# Patient Record
Sex: Male | Born: 1960 | Race: White | Hispanic: No | Marital: Single | Smoking: Current every day smoker
Health system: Southern US, Community
[De-identification: ages and names within clinical notes are randomized; demographics above are authoritative.]

## PROBLEM LIST (undated history)

## (undated) DIAGNOSIS — I10 Essential (primary) hypertension: Secondary | ICD-10-CM

## (undated) HISTORY — PX: TRANSCATHETER MITRAL EDGE TO EDGE REPAIR: CATH118311

---

## 2007-01-25 ENCOUNTER — Ambulatory Visit: Payer: Self-pay | Admitting: Cardiology

## 2007-02-07 ENCOUNTER — Ambulatory Visit: Payer: Self-pay | Admitting: Cardiology

## 2008-12-30 ENCOUNTER — Ambulatory Visit: Payer: Self-pay

## 2009-01-06 ENCOUNTER — Inpatient Hospital Stay: Payer: Self-pay | Admitting: Internal Medicine

## 2009-01-22 ENCOUNTER — Ambulatory Visit: Payer: Self-pay | Admitting: Specialist

## 2009-01-24 ENCOUNTER — Ambulatory Visit: Payer: Self-pay

## 2009-01-27 ENCOUNTER — Ambulatory Visit: Payer: Self-pay

## 2009-02-13 ENCOUNTER — Ambulatory Visit: Payer: Self-pay | Admitting: Specialist

## 2009-02-13 ENCOUNTER — Ambulatory Visit: Payer: Self-pay | Admitting: Internal Medicine

## 2009-02-21 ENCOUNTER — Ambulatory Visit: Payer: Self-pay | Admitting: Internal Medicine

## 2009-02-27 ENCOUNTER — Ambulatory Visit: Payer: Self-pay | Admitting: Internal Medicine

## 2009-02-27 ENCOUNTER — Ambulatory Visit: Payer: Self-pay

## 2009-03-29 ENCOUNTER — Ambulatory Visit: Payer: Self-pay | Admitting: Internal Medicine

## 2009-06-09 ENCOUNTER — Ambulatory Visit: Payer: Self-pay

## 2009-06-29 ENCOUNTER — Ambulatory Visit: Payer: Self-pay | Admitting: Internal Medicine

## 2009-07-21 ENCOUNTER — Ambulatory Visit: Payer: Self-pay | Admitting: Internal Medicine

## 2009-07-30 ENCOUNTER — Ambulatory Visit: Payer: Self-pay | Admitting: Internal Medicine

## 2009-08-29 ENCOUNTER — Ambulatory Visit: Payer: Self-pay | Admitting: Internal Medicine

## 2013-03-13 ENCOUNTER — Observation Stay: Payer: Self-pay | Admitting: Internal Medicine

## 2013-03-13 LAB — BASIC METABOLIC PANEL
Calcium, Total: 9.2 mg/dL (ref 8.5–10.1)
Chloride: 107 mmol/L (ref 98–107)
Co2: 28 mmol/L (ref 21–32)
Creatinine: 1.19 mg/dL (ref 0.60–1.30)
Glucose: 118 mg/dL — ABNORMAL HIGH (ref 65–99)
Potassium: 4.7 mmol/L (ref 3.5–5.1)
Sodium: 139 mmol/L (ref 136–145)

## 2013-03-13 LAB — CBC
Platelet: 273 10*3/uL (ref 150–440)
RBC: 5.43 10*6/uL (ref 4.40–5.90)
RDW: 13 % (ref 11.5–14.5)
WBC: 16.5 10*3/uL — ABNORMAL HIGH (ref 3.8–10.6)

## 2013-03-13 LAB — URINALYSIS, COMPLETE
Bacteria: NONE SEEN
Bilirubin,UR: NEGATIVE
Blood: NEGATIVE
Glucose,UR: NEGATIVE mg/dL (ref 0–75)
Ketone: NEGATIVE
Leukocyte Esterase: NEGATIVE
Nitrite: NEGATIVE
Protein: NEGATIVE
Squamous Epithelial: <1
WBC UR: 2 /HPF (ref 0–5)

## 2013-03-13 LAB — SEDIMENTATION RATE: Erythrocyte Sed Rate: 1 mm/hr (ref 0–20)

## 2013-03-14 LAB — HEMOGLOBIN A1C: Hemoglobin A1C: 5.2 % (ref 4.2–6.3)

## 2013-03-14 LAB — CBC WITH DIFFERENTIAL/PLATELET
Basophil #: 0.1 10*3/uL (ref 0.0–0.1)
Eosinophil #: 0.2 10*3/uL (ref 0.0–0.7)
HCT: 44 % (ref 40.0–52.0)
Lymphocyte #: 2.1 10*3/uL (ref 1.0–3.6)
MCH: 30.7 pg (ref 26.0–34.0)
Monocyte #: 0.8 x10 3/mm (ref 0.2–1.0)
Monocyte %: 9 %
Neutrophil #: 5.7 10*3/uL (ref 1.4–6.5)
Neutrophil %: 63.8 %
RBC: 4.84 10*6/uL (ref 4.40–5.90)
RDW: 13.2 % (ref 11.5–14.5)
WBC: 8.9 10*3/uL (ref 3.8–10.6)

## 2013-03-14 LAB — MAGNESIUM: Magnesium: 2.1 mg/dL

## 2013-03-14 LAB — CREATININE, SERUM
Creatinine: 1.34 mg/dL — ABNORMAL HIGH (ref 0.60–1.30)
EGFR (Non-African Amer.): >60

## 2013-03-14 LAB — LIPID PANEL
HDL Cholesterol: 38 mg/dL — ABNORMAL LOW (ref 40–60)
Ldl Cholesterol, Calc: 103 mg/dL — ABNORMAL HIGH (ref 0–100)

## 2013-03-18 LAB — CULTURE, BLOOD (SINGLE)

## 2013-03-26 DIAGNOSIS — R55 Syncope and collapse: Secondary | ICD-10-CM | POA: Insufficient documentation

## 2013-04-03 DIAGNOSIS — Z23 Encounter for immunization: Secondary | ICD-10-CM | POA: Insufficient documentation

## 2013-06-21 DIAGNOSIS — R000 Tachycardia, unspecified: Secondary | ICD-10-CM | POA: Insufficient documentation

## 2013-06-22 DIAGNOSIS — I10 Essential (primary) hypertension: Secondary | ICD-10-CM | POA: Insufficient documentation

## 2014-05-02 DIAGNOSIS — I059 Rheumatic mitral valve disease, unspecified: Secondary | ICD-10-CM | POA: Insufficient documentation

## 2014-05-02 DIAGNOSIS — I340 Nonrheumatic mitral (valve) insufficiency: Secondary | ICD-10-CM | POA: Insufficient documentation

## 2015-03-21 NOTE — Discharge Summary (Signed)
PATIENT NAME:  [name], [name] MR#:  [id number] DATE OF BIRTH:  03/17/1961  DATE OF ADMISSION:  03/13/2013 DATE OF DISCHARGE:  03/14/2013  PRIMARY CARE PHYSICIAN:  Dr. Moffett.  CONSULTATIONS:  Cardiology, Dr. Fath.   DISCHARGE DIAGNOSES: 1.  Syncope, possibly due to vasovagal reaction.  2.  Leukocytosis, possibly due to reaction.  3.  History of infective endocarditis.  4.  Mitral valve regurgitation with an ejection fraction 30% to 40%.   CONDITION:  Stable.   CODE STATUS:  FULL CODE.  HOME MEDICATIONS:  Aspirin 81 mg by mouth daily, lisinopril 2.5 mg by mouth daily.   DIET:  Low sodium diet.   ACTIVITY:  As tolerated.   FOLLOW-UP CARE:  Follow up with PCP within 1 to 2 weeks.  Follow up with Dr. Fath within 1 to 2 weeks.   HOSPITAL COURSE:  The patient is a 54-year-old Caucasian male with a history of mitral valve regurgitation status post repair and infective endocarditis presented in the ED with syncope episode yesterday.  The patient had a lot of cough and a few headaches, dizziness and then pass out.  For detailed history and physical examination, please refer to the admission note dictated by me yesterday.  On admission date, the patient's CAT scan of head did not show any intracranial abnormality.  Chest x-ray, no acute cardiopulmonary disease.  BUN 13, creatinine 1.19, WBC 16.5, hemoglobin 16.4, troponin was less than 0.02.  BNP 62.  EKG, normal sinus rhythm at 84 beats per minute.  The patient was admitted for syncope and leukocytosis.  Since the patient has a history of infective endocarditis and mitral valve regurgitation, patient was suspected to have possible endocarditis, so we got a blood culture before starting antibiotics.  Blood culture so far is negative.  I start Zosyn to vancomycin yesterday and patient's white count decreased to a normal range today.  In addition, patient's ESR, CRP is in normal range.  Dr. Fath suggests echocardiograph.  He suggested that if  echocardiography is unremarkable then patient may be discharged to home today.  Echocardiograph today showed left ventricular ejection fraction 35% to 40% with a moderate dilated left atrium and severe mitral valve regurgitation.  Carotid duplex showed no stenosis.  The patient has no symptoms.  Vital signs are stable.  Physical examination is unremarkable.  The patient is being discharged to home today.  Since the patient's ejection fraction is 35% to 40%, we will add Lisinopril 2.5 mg by mouth daily, but the patient has no sign of heart failure.  I discussed the patient's discharge plan with the patient.   TIME SPENT:  About 36 minutes.    ____________________________ Qing Chen, MD qc:ea D: 03/14/2013 18:16:57 ET T: 03/14/2013 18:52:00 ET JOB#: [id number]  cc: Qing Chen, MD, <Dictator> QING CHEN MD ELECTRONICALLY SIGNED 03/16/2013 10:06 

## 2015-03-21 NOTE — Consult Note (Signed)
   General Aspect 54 yo male with history of mitral valve repair for severe mr by Dr. Remo LippsSheridan at [facility] approximately 3 years ago who was admitted after reporting to the er with complaints of dizziness while coughing. Pt reported to the er that he had a syncopal episode. Head ct did not reveal any abnormality. Pt was admitted where he has not had any arrythmia and has ruled out for an mil Pt denies to me that he had any syncope. He states that he gets dizzy when he coughs and feels like he might pass out. He denies any dizziness or lightheadedness when he is not coughing. He states he is comliant with his medications.   Physical Exam:  GEN well developed, well nourished, no acute distress   HEENT PERRL, hearing intact to voice   NECK supple  No masses   RESP normal resp effort  clear BS  no use of accessory muscles   CARD Regular rate and rhythm  Murmur   Murmur Systolic   Systolic Murmur axilla   ABD denies tenderness  no liver/spleen enlargement  normal BS  no Adominal Mass   LYMPH negative neck, negative axillae   EXTR negative cyanosis/clubbing, negative edema   SKIN normal to palpation   NEURO cranial nerves intact, motor/sensory function intact   PSYCH A+O to time, place, person   Review of Systems:  Subjective/Chief Complaint dizziness and possible syncope   General: No Complaints   Skin: No Complaints   ENT: No Complaints   Neck: No Complaints   Respiratory: No Complaints   Cardiovascular: No Complaints   Gastrointestinal: No Complaints   Genitourinary: No Complaints   Vascular: No Complaints   Musculoskeletal: No Complaints   Neurologic: Dizzness  Fainting   Hematologic: No Complaints   Endocrine: No Complaints   Psychiatric: No Complaints   Review of Systems: All other systems were reviewed and found to be negative   Medications/Allergies Reviewed Medications/Allergies reviewed     Heart Murmer:    Cardiac Surgery:        Admit  Reason:   Syncope (780.2): Status: Active, Coding System: ICD9, Coded Name: Syncope and collapse  EKG:  EKG NSR    No Known Allergies:    Impression 54 yo male with history of mitral valve repair at [facility] several years ago who was admitted after presenting to the er with complaints of dizziness while coughing and sufferred a syncopal episode. Head ct was unremarkable for signfiant abnormality. He has ruled out for an mi. Echo is pending. No arrythmia on telemetry. Etiology of syncope was likely cough syncope . Will reveiw echo when available.   Plan 1. Conintue current meds 2. Review echo when available 3. Ambualte in am 4. If echo unremarkable and o furhter episodes consider discharge in am.   Electronic Signatures: Dalia HeadingFath, Eldar Robitaille A (MD)  (Signed 15-Apr-14 20:19)  Authored: General Aspect/Present Illness, History and Physical Exam, Review of System, Past Medical History, Health Issues, EKG , Allergies, Impression/Plan   Last Updated: 15-Apr-14 20:19 by Dalia HeadingFath, Shameika Speelman A (MD)

## 2015-03-21 NOTE — H&P (Signed)
PATIENT NAME:  Joe [name], Joe [name] MR#:  [id number] DATE OF BIRTH:  06/16/1961  DATE OF ADMISSION:  03/13/2013  PRIMARY CARE PHYSICIAN:  Dr. Marguerite Olea.  Referring PHYSICIAN:  Dr. Darnelle Catalan.  CHIEF COMPLAINT: Passed out today.   HISTORY OF PRESENT ILLNESS: The patient is a 54 year old Caucasian male with a history of infective endocarditis, severe mitral valve regurgitation status post repair in [facility] 3 years ago who presented in the ED with the above chief complaint. The patient is alert, awake, oriented, in no acute distress. The patient said that he passed out at about 4:30 a.m. After he woke up he had a lot of cough and felt a headache, dizzy before the syncope. He fell and injured his head but the CAT scan of the head did not show any hemorrhage or other abnormality. The patient denies any chest pain, palpitation, orthopnea or nocturnal dyspnea. No leg edema.  He denies any other symptoms.   PAST MEDICAL HISTORY: As mentioned above: Infective endocarditis, severe mitral valve regurgitation and right posterior parietal lobe lesion.   SOCIAL HISTORY: Smokes 1/2 pack a day since 54 years old, no alcohol.  Doubt illicit drugs.   FAMILY HISTORY: No hypertension, diabetes, heart attack or stroke.   PAST SURGICAL HISTORY: Mitral valve regurgitation repair.    REVIEW OF SYSTEMS:   CONSTITUTIONAL: The patient denies any fever or chills. No headache or dizziness. Has a headache and dizziness. No weakness.  EYES: No double vision, elevation.  ENT: No postnasal drip, slurred speech or dysphagia.  CARDIOVASCULAR: No chest pain, palpitation, orthopnea or nocturnal dyspnea. No leg edema.  PULMONARY: Positive for cough, sputum, no shortness of breath or hemoptysis.   GASTROINTESTINAL: No abdominal pain, nausea, vomiting or diarrhea.  GENITOURINARY: No dysuria, hematuria or incontinence.  SKIN: No rash or jaundice.  HEMATOLOGY: No easy bruising or bleeding.  ENDOCRINE: No polyuria or polyphagia, heat or cold  intolerance.  NEUROLOGY: Positive for syncope, loss of consciousness, but no seizure.   ALLERGIES: None.   HOME MEDICATIONS:  Aspirin 81 mg by mouth daily.  PHYSICAL EXAMINATION: VITAL SIGNS: Temperature 98.7, blood pressure 128/89, pulse 79, oxygen saturation 99% on room air.  GENERAL: The patient is alert, awake, oriented, in no acute distress.  HEENT: Pupils round, equal, and reactive to light and accommodation. Moist oral mucosa.  Clear pharynx. NECK: Supple. No JVD or carotid bruits. No lymphadenopathy. No thyromegaly.  CARDIOVASCULAR: S1 and S2 regular rate, rhythm. No murmurs or gallops.  PULMONARY: Bilateral air entry. No wheezing or rales. No use of accessory muscles to breathe.  ABDOMEN: Soft. No distention. No tenderness. No organomegaly. Bowel sounds present.  EXTREMITIES: No edema, clubbing or cyanosis. No calf tenderness. Strong bilateral pedal pulses.  SKIN: No rash or jaundice.  NEUROLOGY: A and o x 3. No focal deficit.   LABORATORY AND DIAGNOSTIC DATA:  CT of the head did not show any intracranial abnormality.  Chest x-ray:  No acute cardiopulmonary disease. Small nodular density at the right lung apex may be related to atypical pleural paroxysmal thickening. WBC 16.5, hemoglobin 16.4, platelets 273, glucose 118, BUN 13, creatinine 1.19. Electrolytes normal. Urinalysis negative.  Troponin less than 0.02. BNP 62. EKG showed normal sinus rhythm at 84 beats per minute.  IMPRESSION: 1.  Syncope.  2.  Leukocytosis.  3.  History of some infective endocarditis.  4.  Mitral valve regurgitation status post repair.   PLAN OF TREATMENT: The patient will be admitted to telemetry. We will get an echocardiograph and a  carotid duplex and continue aspirin. We will check lipid panel and follow up a cardiology consult from Dr. Lady GaryFath.  We will get a blood culture and follow-up CBC and then start Zosyn and vancomycin. GI and DVT prophylaxis. Discussed the patient's condition and plan of  treatment with the patient and the patient's mother.  The patient is full code.   TIME SPENT: About 61 minutes.    ____________________________ Shaune PollackQing Cathy Crounse, MD qc:ct D: 03/13/2013 14:10:56 ET T: 03/13/2013 14:29:24 ET JOB#: [id number]  cc: Shaune PollackQing Demtrius Rounds, MD, <Dictator> Shaune PollackQING Micharl Helmes MD ELECTRONICALLY SIGNED 03/15/2013 21:54

## 2018-06-25 ENCOUNTER — Encounter
Admission: EM | Disposition: A | Discharge status: Home / Self Care | Payer: Self-pay | Source: Home / Self Care | Attending: Emergency Medicine

## 2018-06-25 ENCOUNTER — Emergency Department
Admission: EM | Admit: 2018-06-25 | Discharge: 2018-06-26 | Disposition: A | Discharge status: Home / Self Care | Payer: Federal, State, Local not specified - PPO | Attending: Emergency Medicine | Admitting: Emergency Medicine

## 2018-06-25 ENCOUNTER — Emergency Department: Payer: Federal, State, Local not specified - PPO

## 2018-06-25 ENCOUNTER — Other Ambulatory Visit: Payer: Self-pay

## 2018-06-25 DIAGNOSIS — I10 Essential (primary) hypertension: Secondary | ICD-10-CM | POA: Insufficient documentation

## 2018-06-25 DIAGNOSIS — Y33XXXA Other specified events, undetermined intent, initial encounter: Secondary | ICD-10-CM | POA: Insufficient documentation

## 2018-06-25 DIAGNOSIS — K222 Esophageal obstruction: Secondary | ICD-10-CM | POA: Diagnosis not present

## 2018-06-25 DIAGNOSIS — F17200 Nicotine dependence, unspecified, uncomplicated: Secondary | ICD-10-CM | POA: Diagnosis not present

## 2018-06-25 DIAGNOSIS — T18128A Food in esophagus causing other injury, initial encounter: Secondary | ICD-10-CM | POA: Insufficient documentation

## 2018-06-25 DIAGNOSIS — T199XXA Foreign body in genitourinary tract, part unspecified, initial encounter: Secondary | ICD-10-CM

## 2018-06-25 HISTORY — DX: Essential (primary) hypertension: I10

## 2018-06-25 HISTORY — PX: ESOPHAGOGASTRODUODENOSCOPY (EGD) WITH PROPOFOL: SHX5813

## 2018-06-25 SURGERY — ESOPHAGOGASTRODUODENOSCOPY (EGD) WITH PROPOFOL
Anesthesia: General

## 2018-06-25 MED ORDER — PROPOFOL 10 MG/ML IV BOLUS
INTRAVENOUS | Status: AC
  Filled 2018-06-25: qty 20

## 2018-06-25 MED ORDER — SODIUM CHLORIDE 0.9 % IV BOLUS
1000.0000 mL | Freq: Once | INTRAVENOUS | Status: AC
  Administered 2018-06-26: 1000 mL via INTRAVENOUS

## 2018-06-25 MED ORDER — PROPOFOL 10 MG/ML IV BOLUS
INTRAVENOUS | Status: AC
  Filled 2018-06-25: qty 40

## 2018-06-25 MED ORDER — SODIUM CHLORIDE 0.9 % IV SOLN: INTRAVENOUS | Status: DC

## 2018-06-25 MED ORDER — GLUCAGON HCL RDNA (DIAGNOSTIC) 1 MG IJ SOLR
1.0000 mg | Freq: Once | INTRAMUSCULAR | Status: AC
  Administered 2018-06-25: 1 mg via INTRAVENOUS
  Filled 2018-06-25: qty 1

## 2018-06-25 NOTE — ED Triage Notes (Addendum)
Pt arrives to ED c/o of steak stuck in throat. Points to upper middle chest where he feels like steak is. Alert, oriented, ambulatory. No resp distress noted. States it spitting and able to drink fluids "more slowly than normal." pt talking in complete sentences.

## 2018-06-25 NOTE — ED Provider Notes (Signed)
[facility] Emergency Department Provider Note  ____________________________________________   First MD Initiated Contact with Patient 06/25/18 2144     (approximate)  I have reviewed the triage vital signs and the nursing notes.   HISTORY  Chief Complaint Swallowed Foreign Body    HPI Joe [name] is a 57 y.o. male with no contributory past medical history who presents for evaluation of probable steak bolus impacted in his esophagus.  He says that he was eating at about 2:00 PM when he feels like a piece of steak got stuck.  He has not been able to completely swallow since that time.  He is spitting his saliva into a cup most of the time but he is able to speak and occasionally swallow his secretions without difficulty.  He continues to have a foreign body sensation.  He is not in any respiratory distress.  The onset was acute and the feeling as severe but he is not in distress and nothing in particular makes it better or worse.  He denies chest pain, nausea, vomiting, abdominal pain, and shortness of breath.  This is not happened to him before although sometimes he feels like he gets choked on food but it is never gotten stuck for a long time.  He has never had an esophageal procedure.   Past Medical History:  Diagnosis Date  . Hypertension     There are no active problems to display for this patient.   Past Surgical History:  Procedure Laterality Date  . MITRAL VALVE REPAIR      Prior to Admission medications   Not on File    Allergies Patient has no known allergies.  History reviewed. No pertinent family history.  Social History Social History   Tobacco Use  . Smoking status: Current Every Day Smoker  Substance Use Topics  . Alcohol use: Yes  . Drug use: Not on file    Review of Systems Constitutional: No fever/chills Eyes: No visual changes. ENT: Food bolus impacted in the esophagus.  No sore throat. Cardiovascular: Denies chest  pain. Respiratory: Denies shortness of breath. Gastrointestinal: No abdominal pain.  No nausea, no vomiting.  No diarrhea.  No constipation. Genitourinary: Negative for dysuria. Musculoskeletal: Negative for neck pain.  Negative for back pain. Integumentary: Negative for rash. Neurological: Negative for headaches, focal weakness or numbness.   ____________________________________________   PHYSICAL EXAM:  VITAL SIGNS: ED Triage Vitals  Enc Vitals Group     BP 06/25/18 1811 (!) 185/94     Pulse Rate 06/25/18 1811 (!) 104     Resp 06/25/18 1811 16     Temp 06/25/18 1811 98.6 F (37 C)     Temp Source 06/25/18 1811 Oral     SpO2 06/25/18 1811 96 %     Weight 06/25/18 1812 81.6 kg (180 lb)     Height 06/25/18 1812 1.803 m (5\' 11" )     Head Circumference --      Peak Flow --      Pain Score 06/25/18 1811 2     Pain Loc --      Pain Edu? --      Excl. in GC? --     Constitutional: Alert and oriented. Well appearing and in no acute distress. Eyes: Conjunctivae are normal.  Head: Atraumatic. Nose: No congestion/rhinnorhea.   Mouth/Throat: Mucous membranes are moist. Neck: No stridor.  No meningeal signs.   Cardiovascular: Normal rate, regular rhythm. Good peripheral circulation. Grossly normal heart  sounds. Respiratory: Normal respiratory effort.  No retractions. Lungs CTAB. Gastrointestinal: Soft and nontender.  No distention.  Patient is spitting into a cup but not drooling or tripoding. Musculoskeletal: No lower extremity tenderness nor edema. No gross deformities of extremities. Neurologic:  Normal speech and language. No gross focal neurologic deficits are appreciated.  Skin:  Skin is warm, dry and intact. No rash noted. Psychiatric: Mood and affect are normal. Speech and behavior are normal.  ____________________________________________   LABS (all labs ordered are listed, but only abnormal results are displayed)  Labs Reviewed  CBC WITH DIFFERENTIAL/PLATELET    BASIC METABOLIC PANEL   ____________________________________________  EKG  None - EKG not ordered by ED physician ____________________________________________  RADIOLOGY Marylou MccoyI, Nealie Mchatton, personally viewed and evaluated these images (plain radiographs) as part of my medical decision making, as well as reviewing the written report by the radiologist.  ED MD interpretation: No indication of acute intrathoracic abnormality.  Official radiology report(s): Dg Chest 2 View  Result Date: 06/25/2018 CLINICAL DATA:  Sensation of foreign body stuck in throat/upper chest. EXAM: CHEST - 2 VIEW COMPARISON:  03/13/2013 FINDINGS: Prior median sternotomy and valve replacement. Mild hyperinflation. Heart is normal size. No confluent airspace opacities or effusions. No acute bony abnormality. No visible radiopaque foreign body. IMPRESSION: Hyperinflation.  No active disease. Electronically Signed   By: Charlett NoseKevin  Dover M.D.   On: 06/25/2018 19:29    ____________________________________________   PROCEDURES  Critical Care performed: No   Procedure(s) performed:   Procedures   ____________________________________________   INITIAL IMPRESSION / ASSESSMENT AND PLAN / ED COURSE  As part of my medical decision making, I reviewed the following data within the electronic MEDICAL RECORD NUMBER Nursing notes reviewed and incorporated, Labs reviewed  and A consult was requested and obtained from this/these consultant(s) Gastroenterology (Dr. Allegra LaiVanga).    Differential diagnosis includes, but is not limited to, food bolus impaction, esophageal web, achalasia, globus sensation.  The patient is having no airway compromise and does not appear to have complete esophageal obstruction although he is spitting his secretions into a cup.  I will try glucagon 1 mg IV and have him try to drink some diet Coke to see if any of this will pass.  I will then reassess the need for possible endoscopy.  Clinical Course as of Jun 25 2252  Wynelle LinkSun Jun 25, 2018  2240 The glucagon did not help with the patient's globus sensation.  He then tried drinking some Diet Coke and was able to keep it down only for about 15 or 20 seconds and then spit it back up with a small amount of clear emesis.  He is not able to pass any liquids.  I will call GI.   [CF]  2251 I spoke with Dr. Allegra LaiVanga by phone and explained the situation.  She said she would call the endoscopy suite for upper endoscopy to relieve the food bolus impaction.  I updated the patient.  I have ordered basic lab work and a 1 L normal saline IV fluid bolus since he has not been able to eat or drink anything for more than 8 hours.  He is calm and cooperative, not in distress, but still spitting into a cup.   [CF]    Clinical Course User Index [CF] Loleta RoseForbach, Cataleya Cristina, MD    ____________________________________________  FINAL CLINICAL IMPRESSION(S) / ED DIAGNOSES  Final diagnoses:  Esophageal obstruction due to food impaction     MEDICATIONS GIVEN DURING THIS VISIT:  Medications  sodium chloride 0.9 % bolus 1,000 mL (has no administration in time range)  glucagon (human recombinant) (GLUCAGEN) injection 1 mg (1 mg Intravenous Given 06/25/18 2208)     ED Discharge Orders    None       Note:  This document was prepared using Dragon voice recognition software and may include unintentional dictation errors.    Loleta Rose, MD 06/25/18 2253

## 2018-06-25 NOTE — ED Notes (Signed)
Pt given warm coke per Dr. Scotty CourtStafford verbal orders. Received verbal orders for chest xray as well. Pt has spit cup he is spitting into.

## 2018-06-25 NOTE — ED Notes (Signed)
Patient observed walking around lobby, without difficulty or distress.

## 2018-06-25 NOTE — ED Notes (Addendum)
Cola trial induced vomiting as "it wouldn't go down", pt reports "it's stuck here" -- points to sternum  Emesis was without chunks  Dr York CeriseForbach notified

## 2018-06-26 ENCOUNTER — Other Ambulatory Visit: Payer: Self-pay

## 2018-06-26 ENCOUNTER — Other Ambulatory Visit: Payer: Self-pay | Admitting: Gastroenterology

## 2018-06-26 ENCOUNTER — Emergency Department: Payer: Federal, State, Local not specified - PPO | Admitting: Certified Registered"

## 2018-06-26 ENCOUNTER — Telehealth: Payer: Self-pay | Admitting: Gastroenterology

## 2018-06-26 ENCOUNTER — Encounter: Payer: Self-pay | Admitting: *Deleted

## 2018-06-26 DIAGNOSIS — T18128A Food in esophagus causing other injury, initial encounter: Secondary | ICD-10-CM

## 2018-06-26 DIAGNOSIS — W44F3XA Food entering into or through a natural orifice, initial encounter: Secondary | ICD-10-CM

## 2018-06-26 DIAGNOSIS — K222 Esophageal obstruction: Secondary | ICD-10-CM

## 2018-06-26 LAB — BASIC METABOLIC PANEL
Anion gap: 6 (ref 5–15)
BUN: 19 mg/dL (ref 6–20)
CHLORIDE: 107 mmol/L (ref 98–111)
CO2: 31 mmol/L (ref 22–32)
CREATININE: 1.15 mg/dL (ref 0.61–1.24)
Calcium: 9.1 mg/dL (ref 8.9–10.3)
GFR calc Af Amer: >60 mL/min (ref >60)
GFR calc non Af Amer: >60 mL/min (ref >60)
Glucose, Bld: 115 mg/dL — ABNORMAL HIGH (ref 70–99)
Potassium: 4 mmol/L (ref 3.5–5.1)
Sodium: 144 mmol/L (ref 135–145)

## 2018-06-26 LAB — CBC WITH DIFFERENTIAL/PLATELET
Basophils Absolute: 0.1 10*3/uL (ref 0–0.1)
Basophils Relative: 1 %
EOS ABS: 0.2 10*3/uL (ref 0–0.7)
EOS PCT: 2 %
HCT: 46.9 % (ref 40.0–52.0)
HEMOGLOBIN: 16.2 g/dL (ref 13.0–18.0)
Lymphocytes Relative: 14 %
Lymphs Abs: 2 10*3/uL (ref 1.0–3.6)
MCH: 31.6 pg (ref 26.0–34.0)
MCHC: 34.5 g/dL (ref 32.0–36.0)
MCV: 91.6 fL (ref 80.0–100.0)
MONO ABS: 1 10*3/uL (ref 0.2–1.0)
MONOS PCT: 7 %
Neutro Abs: 11.1 10*3/uL — ABNORMAL HIGH (ref 1.4–6.5)
Neutrophils Relative %: 76 %
Platelets: 241 10*3/uL (ref 150–440)
RBC: 5.12 MIL/uL (ref 4.40–5.90)
RDW: 13.1 % (ref 11.5–14.5)
WBC: 14.4 10*3/uL — ABNORMAL HIGH (ref 3.8–10.6)

## 2018-06-26 MED ORDER — SUCCINYLCHOLINE CHLORIDE 20 MG/ML IJ SOLN
INTRAMUSCULAR | Status: DC | PRN
  Administered 2018-06-26: 80 mg via INTRAVENOUS

## 2018-06-26 MED ORDER — ALBUTEROL SULFATE HFA 108 (90 BASE) MCG/ACT IN AERS
INHALATION_SPRAY | RESPIRATORY_TRACT | Status: AC
  Filled 2018-06-26: qty 6.7

## 2018-06-26 MED ORDER — OMEPRAZOLE 20 MG PO TBDD: 20.0000 mg | DELAYED_RELEASE_TABLET | Freq: Two times a day (BID) | ORAL | 0 refills | Status: DC

## 2018-06-26 MED ORDER — ONDANSETRON HCL 4 MG/2ML IJ SOLN
INTRAMUSCULAR | Status: DC | PRN
  Administered 2018-06-26: 4 mg via INTRAVENOUS

## 2018-06-26 MED ORDER — ALBUTEROL SULFATE HFA 108 (90 BASE) MCG/ACT IN AERS
INHALATION_SPRAY | RESPIRATORY_TRACT | Status: DC | PRN
  Administered 2018-06-26: 6 via RESPIRATORY_TRACT

## 2018-06-26 MED ORDER — PROPOFOL 10 MG/ML IV BOLUS
INTRAVENOUS | Status: DC | PRN
  Administered 2018-06-26: 150 ug/kg/min via INTRAVENOUS

## 2018-06-26 MED ORDER — PROPOFOL 10 MG/ML IV BOLUS
INTRAVENOUS | Status: AC
  Filled 2018-06-26: qty 20

## 2018-06-26 MED ORDER — OMEPRAZOLE 20 MG PO CPDR: 20.0000 mg | DELAYED_RELEASE_CAPSULE | Freq: Two times a day (BID) | ORAL | 0 refills | Status: DC

## 2018-06-26 MED ORDER — LACTATED RINGERS IV SOLN
INTRAVENOUS | Status: DC | PRN
  Administered 2018-06-26: via INTRAVENOUS

## 2018-06-26 MED ORDER — PROPOFOL 10 MG/ML IV BOLUS
INTRAVENOUS | Status: DC | PRN
  Administered 2018-06-26: 160 mg via INTRAVENOUS

## 2018-06-26 NOTE — Transfer of Care (Signed)
Immediate Anesthesia Transfer of Care Note  Patient: Joe [name]  Procedure(s) Performed: ESOPHAGOGASTRODUODENOSCOPY (EGD) WITH PROPOFOL (N/A )  Patient Location: PACU  Anesthesia Type:General  Level of Consciousness: awake, alert  and oriented  Airway & Oxygen Therapy: Patient Spontanous Breathing  Post-op Assessment: Report given to RN and Post -op Vital signs reviewed and stable  Post vital signs: Reviewed and stable  Last Vitals:  Vitals Value Taken Time  BP    Temp    Pulse    Resp    SpO2      Last Pain:  Vitals:   06/26/18 0007  TempSrc:   PainSc: 5          Complications: No apparent anesthesia complications

## 2018-06-26 NOTE — Consult Note (Addendum)
Arlyss Repress, MD [address]  [address]  [place], [place] [address]  Main: [phone]  Fax: [phone] Pager: [phone]   Consultation  Referring Provider:     No ref. provider found Primary Care Physician:  Patient, No Pcp Per Primary Gastroenterologist:     none     Reason for Consultation:     Food impaction  Date of Admission:  06/25/2018 Date of Consultation:  06/26/2018         HPI:   Joe [name] is a 57 y.o. male with history of mitral valve repair presents with unable to pass the steak that he ate around 2 PM yesterday. He's unable to swallow his saliva since then. In the ER, they attempted Coke, glucagon which was unsuccessful. He denies heartburn, prior episodes. CBC and BMP unremarkable He does not have front teeth  NSAIDs: none  Antiplts/Anticoagulants/Anti thrombotics: aspirin 81   Past Medical History:  Diagnosis Date  . Hypertension     Past Surgical History:  Procedure Laterality Date  . MITRAL VALVE REPAIR      Prior to Admission medications   Medication Sig Start Date End Date Taking? Authorizing Provider  aspirin EC 81 MG tablet Take 81 mg by mouth daily.   Yes [provider]  lisinopril (PRINIVIL,ZESTRIL) 2.5 MG tablet Take 2.5 mg by mouth daily.   Yes [provider]    History reviewed. No pertinent family history.   Social History   Tobacco Use  . Smoking status: Current Every Day Smoker  Substance Use Topics  . Alcohol use: Yes  . Drug use: Not on file    Allergies as of 06/25/2018  . (No Known Allergies)    Review of Systems:    All systems reviewed and negative except where noted in HPI.   Physical Exam:  Vital signs in last 24 hours: Temp:  [98.6 F (37 C)] 98.6 F (37 C) (07/28 1811) Pulse Rate:  [80-104] 80 (07/29 0005) Resp:  [16-18] 18 (07/29 0005) BP: (144-185)/(90-105) 163/90 (07/29 0005) SpO2:  [96 %-97 %] 97 % (07/29 0005) Weight:  [180 lb (81.6 kg)] 180 lb (81.6 kg)  (07/28 1812)   General:   Pleasant, cooperative in NAD Head:  Normocephalic and atraumatic. Eyes:   No icterus.   Conjunctiva pink. PERRLA. Ears:  Normal auditory acuity. Neck:  Supple; no masses or thyroidomegaly Lungs: Respirations even and unlabored. Lungs clear to auscultation bilaterally.   No wheezes, crackles, or rhonchi.  Heart:  Regular rate and rhythm;  Without murmur, clicks, rubs or gallops Abdomen:  Soft, nondistended, nontender. Normal bowel sounds. No appreciable masses or hepatomegaly.  No rebound or guarding.  Rectal:  Not performed. Msk:  Symmetrical without gross deformities.  Strength normal  Extremities:  Without edema, cyanosis or clubbing. Neurologic:  Alert and oriented x3;  grossly normal neurologically. Skin:  Intact without significant lesions or rashes. Cervical Nodes:  No significant cervical adenopathy. Psych:  Alert and cooperative. Normal affect.  LAB RESULTS: CBC Latest Ref Rng & Units 06/25/2018 03/14/2013 03/13/2013  WBC 3.8 - 10.6 K/uL 14.4(H) 8.9 16.5(H)  Hemoglobin 13.0 - 18.0 g/dL 96.2 95.2 84.1  Hematocrit 40.0 - 52.0 % 46.9 44.0 49.0  Platelets 150 - 440 K/uL 241 221 273    BMET BMP Latest Ref Rng & Units 06/25/2018 03/14/2013 03/13/2013  Glucose 70 - 99 mg/dL 324(M) - 010(U)  BUN 6 - 20 mg/dL 19 - 13  Creatinine 7.25 - 1.24  mg/dL 1.301.15 8.65(H1.34(H) 8.461.19  Sodium 135 - 145 mmol/L 144 - 139  Potassium 3.5 - 5.1 mmol/L 4.0 - 4.7  Chloride 98 - 111 mmol/L 107 - 107  CO2 22 - 32 mmol/L 31 - 28  Calcium 8.9 - 10.3 mg/dL 9.1 - 9.2    LFT No flowsheet data found.   STUDIES: Dg Chest 2 View  Result Date: 06/25/2018 CLINICAL DATA:  Sensation of foreign body stuck in throat/upper chest. EXAM: CHEST - 2 VIEW COMPARISON:  03/13/2013 FINDINGS: Prior median sternotomy and valve replacement. Mild hyperinflation. Heart is normal size. No confluent airspace opacities or effusions. No acute bony abnormality. No visible radiopaque foreign body. IMPRESSION:  Hyperinflation.  No active disease. Electronically Signed   By: Charlett NoseKevin  Dover M.D.   On: 06/25/2018 19:29      Impression / Plan:   [name] is a 57 y.o. male with h/o mitral valve repair, presented with food impaction after eating piece of steak, likely due to poor dentition  Proceed with EGD  I have discussed alternative options, risks & benefits,  which include, but are not limited to, bleeding, infection, perforation,respiratory complication & drug reaction.  The patient agrees with this plan & written consent will be obtained.    Thank you for involving me in the care of this patient.      LOS: 0 days   Lannette Donathohini Trysta Showman, MD  06/26/2018, 12:12 AM   Note: This dictation was prepared with Dragon dictation along with smaller phrase technology. Any transcriptional errors that result from this process are unintentional.

## 2018-06-26 NOTE — Anesthesia Post-op Follow-up Note (Signed)
Anesthesia QCDR form completed.        

## 2018-06-26 NOTE — ED Notes (Signed)
Endo RN and OR tech at bedside with w/c to transport pt to endo, pt made a phone call to family members at 2350 to inform them he would need a ride when his procedure was complete, Blank procedure consent given to RN

## 2018-06-26 NOTE — Anesthesia Procedure Notes (Signed)
Procedure Name: Intubation Date/Time: 06/26/2018 12:29 AM Performed by: Chanetta Marshall, CRNA Pre-anesthesia Checklist: Patient identified, Emergency Drugs available, Suction available and Patient being monitored Patient Re-evaluated:Patient Re-evaluated prior to induction Oxygen Delivery Method: Circle system utilized Preoxygenation: Pre-oxygenation with 100% oxygen Induction Type: IV induction Laryngoscope Size: Mac and 3 Grade View: Grade I Number of attempts: 1 Airway Equipment and Method: Stylet Placement Confirmation: ETT inserted through vocal cords under direct vision,  positive ETCO2,  breath sounds checked- equal and bilateral and CO2 detector Secured at: 22 cm Tube secured with: Tape Dental Injury: Teeth and Oropharynx as per pre-operative assessment

## 2018-06-26 NOTE — Op Note (Signed)
[facility] Gastroenterology Patient Name: Joe [name] Procedure Date: 06/26/2018 12:28 AM MRN: [id number] Account #: [id number] Date of Birth: [birth year] Admit Type: Outpatient Age: 57 Room: [facility] ENDO ROOM 4 Gender: Male Note Status: Finalized Procedure:            Upper GI endoscopy Indications:          Foreign body in the esophagus Providers:            Toney Reil MD, MD Referring MD:         No Local Md, MD (Referring MD) Medicines:            General Anesthesia Complications:        No immediate complications. Estimated blood loss:                        Minimal. Procedure:            Pre-Anesthesia Assessment:                       - Prior to the procedure, a History and Physical was                        performed, and patient medications and allergies were                        reviewed. The patient is competent. The risks and                        benefits of the procedure and the sedation options and                        risks were discussed with the patient. All questions                        were answered and informed consent was obtained.                        Patient identification and proposed procedure were                        verified by the physician, the nurse, the                        anesthesiologist, the anesthetist and the technician in                        the pre-procedure area in the procedure room in the                        endoscopy suite. Mental Status Examination: alert and                        oriented. Airway Examination: normal oropharyngeal                        airway and neck mobility. Respiratory Examination:                        clear to auscultation. CV Examination: normal.  Prophylactic Antibiotics: The patient does not require                        prophylactic antibiotics. Prior Anticoagulants: The                        patient has taken aspirin, last dose was 2 days  prior                        to procedure. ASA Grade Assessment: II - A patient with                        mild systemic disease. After reviewing the risks and                        benefits, the patient was deemed in satisfactory                        condition to undergo the procedure. The anesthesia plan                        was to use general anesthesia. Immediately prior to                        administration of medications, the patient was                        re-assessed for adequacy to receive sedatives. The                        heart rate, respiratory rate, oxygen saturations, blood                        pressure, adequacy of pulmonary ventilation, and                        response to care were monitored throughout the                        procedure. The physical status of the patient was                        re-assessed after the procedure.                       After obtaining informed consent, the endoscope was                        passed under direct vision. Throughout the procedure,                        the patient's blood pressure, pulse, and oxygen                        saturations were monitored continuously. The Endoscope                        was introduced through the mouth, and advanced to the  second part of duodenum. The upper GI endoscopy was                        accomplished without difficulty. The patient tolerated                        the procedure well. Findings:      Food was found in the distal esophagus. Removal of food was accomplished       by push technique with minimal resistance. Food reached stomach.       Estimated blood loss was minimal. Biopsies were obtained from the       proximal and distal esophagus with cold forceps for histology of       suspected eosinophilic esophagitis.      The entire examined stomach was normal.      The cardia and gastric fundus were normal on retroflexion.      The  duodenal bulb and second portion of the duodenum were normal. Impression:           - Food in the distal esophagus. Biopsied. Removal was                        successful.                       - Normal stomach.                       - Normal duodenal bulb and second portion of the                        duodenum. Recommendation:       - Discharge patient to home (with escort).                       - Clear liquid diet today, advance diet tomorrow.                       - Poor dentition probably cause of food impaction                       - Continue present medications.                       - Use Prilosec (omeprazole) 20 mg PO BID for 4 weeks.                       - Await pathology results. Procedure Code(s):    --- Professional ---                       [phone], Esophagogastroduodenoscopy, flexible, transoral;                        with removal of foreign body(s)                       43239, Esophagogastroduodenoscopy, flexible, transoral;                        with biopsy, single or multiple Diagnosis Code(s):    --- Professional ---  W09.811B, Food in esophagus causing other injury,                        initial encounter                       T18.108A, Unspecified foreign body in esophagus causing                        other injury, initial encounter CPT copyright 2017 American Medical Association. All rights reserved. The codes documented in this report are preliminary and upon coder review may  be revised to meet current compliance requirements. Dr. Libby Maw Toney Reil MD, MD 06/26/2018 12:47:26 AM This report has been signed electronically. Number of Addenda: 0 Note Initiated On: 06/26/2018 12:28 AM      [facility]

## 2018-06-26 NOTE — Telephone Encounter (Signed)
PT LEFT VM HE SAW DR.VANGA IN ED LAST NIGHT SHE WROTE A PRESCRIPTION FOR TABLETS BUT INSURANCE WILL ONLY COVER THE CAPSULE PLEASE RESEND IT TO  [facility] IN [place] IN CAPSULE FORM

## 2018-06-26 NOTE — Anesthesia Preprocedure Evaluation (Signed)
Anesthesia Evaluation  Patient identified by MRN, date of birth, ID band Patient awake    Reviewed: Allergy & Precautions, H&P , NPO status , Patient's Chart, lab work & pertinent test results, reviewed documented beta blocker date and time   History of Anesthesia Complications Negative for: history of anesthetic complications  Airway Mallampati: III  TM Distance: >3 FB Neck ROM: full    Dental  (+) Dental Advidsory Given, Caps, Missing, Chipped, Poor Dentition   Pulmonary neg shortness of breath, neg sleep apnea, neg COPD, neg recent URI, Current Smoker,           Cardiovascular Exercise Tolerance: Good hypertension, (-) angina(-) CAD, (-) Past MI, (-) Cardiac Stents and (-) CABG (-) dysrhythmias + Valvular Problems/Murmurs (s/p mitral valve repair)      Neuro/Psych negative neurological ROS  negative psych ROS   GI/Hepatic negative GI ROS, Neg liver ROS,   Endo/Other  negative endocrine ROS  Renal/GU negative Renal ROS  negative genitourinary   Musculoskeletal   Abdominal   Peds  Hematology negative hematology ROS (+)   Anesthesia Other Findings Past Medical History: No date: Hypertension   Reproductive/Obstetrics negative OB ROS                             Anesthesia Physical Anesthesia Plan  ASA: II and emergent  Anesthesia Plan: General   Post-op Pain Management:    Induction: Intravenous, Rapid sequence and Cricoid pressure planned  PONV Risk Score and Plan: 1 and Ondansetron and Dexamethasone  Airway Management Planned: Oral ETT  Additional Equipment:   Intra-op Plan:   Post-operative Plan: Extubation in OR  Informed Consent: I have reviewed the patients History and Physical, chart, labs and discussed the procedure including the risks, benefits and alternatives for the proposed anesthesia with the patient or authorized representative who has indicated his/her  understanding and acceptance.   Dental Advisory Given  Plan Discussed with: Anesthesiologist, CRNA and Surgeon  Anesthesia Plan Comments:         Anesthesia Quick Evaluation

## 2018-06-26 NOTE — Telephone Encounter (Signed)
Medication has been resent today by Dr. Allegra LaiVanga

## 2018-06-27 LAB — SURGICAL PATHOLOGY

## 2018-06-28 ENCOUNTER — Telehealth: Payer: Self-pay | Admitting: Gastroenterology

## 2018-06-28 ENCOUNTER — Other Ambulatory Visit: Payer: Self-pay

## 2018-06-28 MED ORDER — OMEPRAZOLE 40 MG PO CPDR: 40.0000 mg | DELAYED_RELEASE_CAPSULE | Freq: Two times a day (BID) | ORAL | 1 refills | Status: DC

## 2018-06-28 NOTE — Telephone Encounter (Signed)
Pt left vm he states we have been trying to reach him for his Lab results but his Cell number is wrong in chart I will update please call [phone]

## 2018-06-29 NOTE — Telephone Encounter (Signed)
Spoke with pt and notified him of lab results and prescription sent to pharmacy

## 2018-07-04 NOTE — Anesthesia Postprocedure Evaluation (Signed)
Anesthesia Post Note  Patient: Joe BeetsMark A [name]  Procedure(s) Performed: ESOPHAGOGASTRODUODENOSCOPY (EGD) WITH PROPOFOL (N/A )  Patient location during evaluation: PACU Anesthesia Type: General Level of consciousness: awake and alert Pain management: pain level controlled Vital Signs Assessment: post-procedure vital signs reviewed and stable Respiratory status: spontaneous breathing, nonlabored ventilation, respiratory function stable and patient connected to nasal cannula oxygen Cardiovascular status: blood pressure returned to baseline and stable Postop Assessment: no apparent nausea or vomiting Anesthetic complications: no     Last Vitals:  Vitals:   06/26/18 0125 06/26/18 0130  BP: 132/82   Pulse: 84 82  Resp: (!) 24 (!) 22  Temp:  (!) 36.4 C  SpO2: 96% 96%    Last Pain:  Vitals:   06/26/18 0125  TempSrc:   PainSc: 0-No pain                 Lenard SimmerAndrew Icey Tello

## 2020-08-05 ENCOUNTER — Encounter: Payer: Self-pay | Admitting: *Deleted

## 2020-08-05 ENCOUNTER — Emergency Department: Payer: Federal, State, Local not specified - PPO

## 2020-08-05 ENCOUNTER — Other Ambulatory Visit: Payer: Self-pay

## 2020-08-05 DIAGNOSIS — M545 Low back pain: Secondary | ICD-10-CM | POA: Diagnosis not present

## 2020-08-05 DIAGNOSIS — F17200 Nicotine dependence, unspecified, uncomplicated: Secondary | ICD-10-CM | POA: Insufficient documentation

## 2020-08-05 DIAGNOSIS — R300 Dysuria: Secondary | ICD-10-CM | POA: Diagnosis present

## 2020-08-05 DIAGNOSIS — Z7982 Long term (current) use of aspirin: Secondary | ICD-10-CM | POA: Insufficient documentation

## 2020-08-05 DIAGNOSIS — R109 Unspecified abdominal pain: Secondary | ICD-10-CM | POA: Diagnosis not present

## 2020-08-05 DIAGNOSIS — Z20822 Contact with and (suspected) exposure to covid-19: Secondary | ICD-10-CM | POA: Diagnosis not present

## 2020-08-05 DIAGNOSIS — I10 Essential (primary) hypertension: Secondary | ICD-10-CM | POA: Diagnosis not present

## 2020-08-05 DIAGNOSIS — R0789 Other chest pain: Secondary | ICD-10-CM | POA: Insufficient documentation

## 2020-08-05 DIAGNOSIS — J189 Pneumonia, unspecified organism: Secondary | ICD-10-CM | POA: Diagnosis not present

## 2020-08-05 DIAGNOSIS — Z79899 Other long term (current) drug therapy: Secondary | ICD-10-CM | POA: Diagnosis not present

## 2020-08-05 LAB — URINALYSIS, COMPLETE (UACMP) WITH MICROSCOPIC
Bacteria, UA: NONE SEEN
Bilirubin Urine: NEGATIVE
Glucose, UA: NEGATIVE mg/dL
Hgb urine dipstick: NEGATIVE
Ketones, ur: 20 mg/dL — AB
Leukocytes,Ua: NEGATIVE
Nitrite: NEGATIVE
Protein, ur: 30 mg/dL — AB
Specific Gravity, Urine: 1.025 (ref 1.005–1.030)
pH: 5 (ref 5.0–8.0)

## 2020-08-05 LAB — CBC
HCT: 42.7 % (ref 39.0–52.0)
Hemoglobin: 14.3 g/dL (ref 13.0–17.0)
MCH: 31.6 pg (ref 26.0–34.0)
MCHC: 33.5 g/dL (ref 30.0–36.0)
MCV: 94.5 fL (ref 80.0–100.0)
Platelets: 331 10*3/uL (ref 150–400)
RBC: 4.52 MIL/uL (ref 4.22–5.81)
RDW: 12.3 % (ref 11.5–15.5)
WBC: 15.1 10*3/uL — ABNORMAL HIGH (ref 4.0–10.5)
nRBC: 0 % (ref 0.0–0.2)

## 2020-08-05 LAB — COMPREHENSIVE METABOLIC PANEL
ALT: 19 U/L (ref 0–44)
AST: 16 U/L (ref 15–41)
Albumin: 3.7 g/dL (ref 3.5–5.0)
Alkaline Phosphatase: 52 U/L (ref 38–126)
Anion gap: 9 (ref 5–15)
BUN: 15 mg/dL (ref 6–20)
CO2: 28 mmol/L (ref 22–32)
Calcium: 9.4 mg/dL (ref 8.9–10.3)
Chloride: 100 mmol/L (ref 98–111)
Creatinine, Ser: 1.22 mg/dL (ref 0.61–1.24)
GFR calc Af Amer: >60 mL/min (ref >60)
GFR calc non Af Amer: >60 mL/min (ref >60)
Glucose, Bld: 107 mg/dL — ABNORMAL HIGH (ref 70–99)
Potassium: 4.8 mmol/L (ref 3.5–5.1)
Sodium: 137 mmol/L (ref 135–145)
Total Bilirubin: 1.2 mg/dL (ref 0.3–1.2)
Total Protein: 7.7 g/dL (ref 6.5–8.1)

## 2020-08-05 NOTE — ED Triage Notes (Signed)
Pt reports low back pain with dysuria.  Sx since last week.  No hx kidney stones   No penile discharge.  Pt alert.

## 2020-08-06 ENCOUNTER — Emergency Department
Admission: EM | Admit: 2020-08-06 | Discharge: 2020-08-06 | Disposition: A | Discharge status: Home / Self Care | Payer: Federal, State, Local not specified - PPO | Attending: Emergency Medicine | Admitting: Emergency Medicine

## 2020-08-06 ENCOUNTER — Emergency Department: Payer: Federal, State, Local not specified - PPO

## 2020-08-06 DIAGNOSIS — R079 Chest pain, unspecified: Secondary | ICD-10-CM

## 2020-08-06 DIAGNOSIS — R300 Dysuria: Secondary | ICD-10-CM

## 2020-08-06 DIAGNOSIS — J189 Pneumonia, unspecified organism: Secondary | ICD-10-CM

## 2020-08-06 LAB — SARS CORONAVIRUS 2 BY RT PCR (HOSPITAL ORDER, PERFORMED IN ~~LOC~~ HOSPITAL LAB): SARS Coronavirus 2: NEGATIVE

## 2020-08-06 MED ORDER — SODIUM CHLORIDE 0.9 % IV SOLN: 500.0000 mg | Freq: Once | INTRAVENOUS | Status: DC

## 2020-08-06 MED ORDER — SODIUM CHLORIDE 0.9 % IV SOLN: 1.0000 g | Freq: Once | INTRAVENOUS | Status: DC

## 2020-08-06 MED ORDER — DOXYCYCLINE HYCLATE 50 MG PO CAPS: 100.0000 mg | ORAL_CAPSULE | Freq: Two times a day (BID) | ORAL | 0 refills | Status: AC

## 2020-08-06 NOTE — ED Provider Notes (Signed)
[facility] Emergency Department Provider Note   ____________________________________________   First MD Initiated Contact with Patient 08/06/20 1230     (approximate)  I have reviewed the triage vital signs and the nursing notes.   HISTORY  Chief Complaint Back Pain and Dysuria    HPI Joe [name] is a 59 y.o. male with a past medical history of hypertension presents for left lower back pain and dysuria that has been worsening over the last week and has no exacerbating or relieving factors.  Patient describes this pain as an aching, 4/10, left posterior flank and thoracic back pain that does not radiate.         Past Medical History:  Diagnosis Date  . Hypertension     Patient Active Problem List   Diagnosis Date Noted  . Esophageal obstruction due to food impaction     Past Surgical History:  Procedure Laterality Date  . ESOPHAGOGASTRODUODENOSCOPY (EGD) WITH PROPOFOL N/A 06/25/2018   Procedure: ESOPHAGOGASTRODUODENOSCOPY (EGD) WITH PROPOFOL;  Surgeon: Toney Reil, MD;  Location: [facility] ENDOSCOPY;  Service: Gastroenterology;  Laterality: N/A;  . MITRAL VALVE REPAIR      Prior to Admission medications   Medication Sig Start Date End Date Taking? Authorizing Provider  aspirin EC 81 MG tablet Take 81 mg by mouth daily.    [provider]  doxycycline (VIBRAMYCIN) 50 MG capsule Take 2 capsules (100 mg total) by mouth 2 (two) times daily for 7 days. 08/06/20 08/13/20  Merwyn Katos, MD  lisinopril (PRINIVIL,ZESTRIL) 2.5 MG tablet Take 2.5 mg by mouth daily.    [provider]  omeprazole (PRILOSEC) 40 MG capsule Take 1 capsule (40 mg total) by mouth 2 (two) times daily. 06/28/18   Toney Reil, MD    Allergies Patient has no known allergies.  No family history on file.  Social History Social History   Tobacco Use  . Smoking status: Current Every Day Smoker  . Smokeless tobacco: Never Used  Substance Use  Topics  . Alcohol use: Yes  . Drug use: Not Currently    Review of Systems Constitutional: No fever/chills Eyes: No visual changes. ENT: No sore throat. Cardiovascular: Denies chest pain. Respiratory: Denies shortness of breath. Gastrointestinal: No abdominal pain.  No nausea, no vomiting.  No diarrhea. Genitourinary: Positive for dysuria. Musculoskeletal: Positive for back pain.  Negative for acute arthralgias Skin: Negative for rash. Neurological: Negative for headaches, weakness/numbness/paresthesias in any extremity Psychiatric: Negative for suicidal ideation/homicidal ideation   ____________________________________________   PHYSICAL EXAM:  VITAL SIGNS: ED Triage Vitals  Enc Vitals Group     BP 08/05/20 1934 135/90     Pulse Rate 08/05/20 1934 (!) 111     Resp 08/05/20 1934 20     Temp 08/05/20 1934 (!) 100.5 F (38.1 C)     Temp Source 08/05/20 1934 Oral     SpO2 08/05/20 1934 97 %     Weight 08/05/20 1935 180 lb (81.6 kg)     Height 08/05/20 1935 5\' 11"  (1.803 m)     Head Circumference --      Peak Flow --      Pain Score 08/05/20 1935 8     Pain Loc --      Pain Edu? --      Excl. in GC? --    Constitutional: Alert and oriented. Well appearing and in no acute distress. Eyes: Conjunctivae are normal. PERRL. EOMI. Head: Atraumatic. Nose: No congestion/rhinnorhea. Mouth/Throat: Mucous  membranes are moist.  Oropharynx non-erythematous. Neck: No stridor.   Cardiovascular: Normal rate, regular rhythm. Grossly normal heart sounds.  Good peripheral circulation. Respiratory: Normal respiratory effort.  No retractions. Lungs CTAB. Gastrointestinal: Soft and nontender. No distention. No abdominal bruits. No CVA tenderness. Musculoskeletal: No lower extremity tenderness nor edema.  No joint effusions. Neurologic:  Normal speech and language. No gross focal neurologic deficits are appreciated. No gait instability. Skin:  Skin is warm, dry and intact. No rash  noted. Psychiatric: Mood and affect are normal. Speech and behavior are normal.  ____________________________________________   LABS (all labs ordered are listed, but only abnormal results are displayed)  Labs Reviewed  COMPREHENSIVE METABOLIC PANEL - Abnormal; Notable for the following components:      Result Value   Glucose, Bld 107 (*)    All other components within normal limits  CBC - Abnormal; Notable for the following components:   WBC 15.1 (*)    All other components within normal limits  URINALYSIS, COMPLETE (UACMP) WITH MICROSCOPIC - Abnormal; Notable for the following components:   Color, Urine YELLOW (*)    APPearance HAZY (*)    Ketones, ur 20 (*)    Protein, ur 30 (*)    All other components within normal limits  SARS CORONAVIRUS 2 BY RT PCR (HOSPITAL ORDER, PERFORMED IN [facility])   ___________________________________________________________________________________  RADIOLOGY  ED MD interpretation: 2 view chest x-ray as well as CT renal stone study show significant left lower lobe pneumonia without any other acute abnormalities  Official radiology report(s): DG Chest 2 View  Result Date: 08/06/2020 CLINICAL DATA:  Possible pneumonia on chest x-ray EXAM: CHEST - 2 VIEW COMPARISON:  Abdominal CT from yesterday FINDINGS: Left lower lobe infiltrate along the major fissure. Prior median sternotomy for mitral annuloplasty. No edema, effusion, or pneumothorax. IMPRESSION: Left lower lobe infiltrate. Followup PA and lateral chest X-ray is recommended in 3-4 weeks following trial of antibiotic therapy to ensure resolution. Electronically Signed   By: Marnee Spring M.D.   On: 08/06/2020 11:13   CT Renal Stone Study  Result Date: 08/05/2020 CLINICAL DATA:  Low back pain, flank pain EXAM: CT ABDOMEN AND PELVIS WITHOUT CONTRAST TECHNIQUE: Multidetector CT imaging of the abdomen and pelvis was performed following the standard protocol without IV contrast.  COMPARISON:  07/28/2009 FINDINGS: Lower chest: Airspace opacity in the left lower lobe concerning for pneumonia. No effusions. Hepatobiliary: No focal hepatic abnormality. Gallbladder unremarkable. Pancreas: No focal abnormality or ductal dilatation. Spleen: No focal abnormality.  Normal size. Adrenals/Urinary Tract: 3.2 cm low-density lesion in the upper pole of the right kidney, likely cyst. This is enlarged from 2.1 cm on prior study. No hydronephrosis. No renal or ureteral stones. Adrenal glands and urinary bladder unremarkable. Stomach/Bowel: Stomach, large and small bowel grossly unremarkable. Appendix is normal. Vascular/Lymphatic: Aortic atherosclerosis. No evidence of aneurysm or adenopathy. Reproductive: Mildly prominent prostate with central calcifications. Other: No free fluid or free air. Musculoskeletal: No acute bony abnormality. IMPRESSION: Consolidation in the left lower lobe concerning for pneumonia. No renal or ureteral stones.  No hydronephrosis. Aortic atherosclerosis. Electronically Signed   By: Charlett Nose M.D.   On: 08/05/2020 23:44    ____________________________________________   PROCEDURES  Procedure(s) performed (including Critical Care):  Procedures   ____________________________________________   INITIAL IMPRESSION / ASSESSMENT AND PLAN / ED COURSE      Presents with dysuria and left-sided back pain. DDx: PE, COPD exacerbation, Pneumothorax, TB, Atypical ACS, Esophageal Rupture, Toxic Exposure,  Foreign Body Airway Obstruction. Workup: CXR CBC, CMP, Trop, Lactate  Given History, Exam, and Workup presentation most consistent with pneumonia.  Findings:  Single Focus Consolidation on CXR  Rx: Doxycycline 100mg  BID x10days  Reassessment: Patient resting comfortably in no respiratory distress.  Patient not requiring any supplemental oxygen and agrees with plan for discharge home.  Disposition: Discharge home. Return precautions discussed at bedside and  patient in agreement with plan. Prompt follow up with primary care provider advised.     ____________________________________________   FINAL CLINICAL IMPRESSION(S) / ED DIAGNOSES  Final diagnoses:  Dysuria  Pneumonia of left lower lobe due to infectious organism  Chest pain, unspecified type     ED Discharge Orders         Ordered    doxycycline (VIBRAMYCIN) 50 MG capsule  2 times daily        08/06/20 1335           Note:  This document was prepared using Dragon voice recognition software and may include unintentional dictation errors.   10/06/20, MD 08/06/20 [phone]

## 2022-01-25 DIAGNOSIS — H66002 Acute suppurative otitis media without spontaneous rupture of ear drum, left ear: Secondary | ICD-10-CM | POA: Diagnosis not present

## 2022-01-25 DIAGNOSIS — B9689 Other specified bacterial agents as the cause of diseases classified elsewhere: Secondary | ICD-10-CM | POA: Diagnosis not present

## 2022-01-25 DIAGNOSIS — J019 Acute sinusitis, unspecified: Secondary | ICD-10-CM | POA: Diagnosis not present

## 2022-02-18 ENCOUNTER — Emergency Department
Admission: EM | Admit: 2022-02-18 | Discharge: 2022-02-18 | Disposition: A | Discharge status: Home / Self Care | Payer: Federal, State, Local not specified - PPO | Attending: Emergency Medicine | Admitting: Emergency Medicine

## 2022-02-18 ENCOUNTER — Other Ambulatory Visit: Payer: Self-pay

## 2022-02-18 ENCOUNTER — Emergency Department: Payer: Federal, State, Local not specified - PPO

## 2022-02-18 ENCOUNTER — Encounter: Payer: Self-pay | Admitting: Emergency Medicine

## 2022-02-18 DIAGNOSIS — I10 Essential (primary) hypertension: Secondary | ICD-10-CM | POA: Insufficient documentation

## 2022-02-18 DIAGNOSIS — R079 Chest pain, unspecified: Secondary | ICD-10-CM | POA: Diagnosis not present

## 2022-02-18 DIAGNOSIS — R0789 Other chest pain: Secondary | ICD-10-CM | POA: Diagnosis not present

## 2022-02-18 LAB — BASIC METABOLIC PANEL
Anion gap: 7 (ref 5–15)
BUN: 14 mg/dL (ref 8–23)
CO2: 28 mmol/L (ref 22–32)
Calcium: 8.9 mg/dL (ref 8.9–10.3)
Chloride: 104 mmol/L (ref 98–111)
Creatinine, Ser: 1.18 mg/dL (ref 0.61–1.24)
GFR, Estimated: >60 mL/min (ref >60)
Glucose, Bld: 143 mg/dL — ABNORMAL HIGH (ref 70–99)
Potassium: 3.5 mmol/L (ref 3.5–5.1)
Sodium: 139 mmol/L (ref 135–145)

## 2022-02-18 LAB — CBC
HCT: 46.8 % (ref 39.0–52.0)
Hemoglobin: 15.8 g/dL (ref 13.0–17.0)
MCH: 31 pg (ref 26.0–34.0)
MCHC: 33.8 g/dL (ref 30.0–36.0)
MCV: 91.8 fL (ref 80.0–100.0)
Platelets: 272 10*3/uL (ref 150–400)
RBC: 5.1 MIL/uL (ref 4.22–5.81)
RDW: 12.1 % (ref 11.5–15.5)
WBC: 6.6 10*3/uL (ref 4.0–10.5)
nRBC: 0 % (ref 0.0–0.2)

## 2022-02-18 LAB — TROPONIN I (HIGH SENSITIVITY)
Troponin I (High Sensitivity): 9 ng/L (ref <18)
Troponin I (High Sensitivity): 9 ng/L (ref <18)

## 2022-02-18 NOTE — Discharge Instructions (Signed)
Please call the number provided for cardiology to arrange a follow-up appointment.  Return to the emergency department for any return of/worsening chest pain, trouble breathing, or any other symptom personally concerning to yourself. ?

## 2022-02-18 NOTE — ED Triage Notes (Signed)
Pt via POV from home. Pt non-radiating, intermittent L-sided chest pain that started last night. Denies any other symptoms. Denies cough/SOB. Denies NVD. Pt has a hx of mitral valve replacement in 2006 but no other cardiac hx. Pt is A&Ox4 and NAD.  ?

## 2022-02-18 NOTE — ED Provider Notes (Signed)
? ?[facility] ?Provider Note ? ? ? Event Date/Time  ? First MD Initiated Contact with Patient 02/18/22 [phone]   ?  (approximate) ? ?History  ? ?Chief Complaint: Chest Pain ? ?HPI ? ?Joe [name] is a 61 y.o. male with a past medical history of hypertension, prior mitral valve repair in 2006 who presents to the emergency department for chest pain.  According to the patient over the past week or 2 he has been experiencing intermittent sharp pains in the left chest.  States they only last a very brief time and then go away.  Denies any nausea dyspnea or diaphoresis.  Patient denies any history of ACS, stents, etc.  No anticoagulation.  Patient denies any lower extremity swelling.  No shortness of breath or cough. ? ?Physical Exam  ? ?Triage Vital Signs: ?ED Triage Vitals  ?Enc Vitals Group  ?   BP 02/18/22 0716 (!) 173/90  ?   Pulse Rate 02/18/22 0716 99  ?   Resp 02/18/22 0716 17  ?   Temp 02/18/22 0716 99.6 ?F (37.6 ?C)  ?   Temp Source 02/18/22 0716 Oral  ?   SpO2 02/18/22 0716 97 %  ?   Weight 02/18/22 0715 185 lb (83.9 kg)  ?   Height 02/18/22 0715 5' 10.5" (1.791 m)  ?   Head Circumference --   ?   Peak Flow --   ?   Pain Score 02/18/22 0716 5  ?   Pain Loc --   ?   Pain Edu? --   ?   Excl. in GC? --   ? ? ?Most recent vital signs: ?Vitals:  ? 02/18/22 0716  ?BP: (!) 173/90  ?Pulse: 99  ?Resp: 17  ?Temp: 99.6 ?F (37.6 ?C)  ?SpO2: 97%  ? ? ?General: Awake, no distress.  ?CV:  Good peripheral perfusion.  Regular rate and rhythm  ?Resp:  Normal effort.  Equal breath sounds bilaterally.  ?Abd:  No distention.  Soft, nontender.  No rebound or guarding. ?Other:  No lower extremity edema/tenderness.  Chest wall is nontender to palpation. ? ? ?ED Results / Procedures / Treatments  ? ?EKG ? ?EKG viewed and interpreted by myself shows a normal sinus rhythm at 97 bpm with a narrow QRS, normal axis, normal intervals, no concerning ST changes. ? ?RADIOLOGY ? ?I personally viewed the chest x-ray images.   No acute significant abnormality seen on my evaluation. ?Radiology is read the chest x-ray is negative. ? ? ?MEDICATIONS ORDERED IN ED: ?Medications - No data to display ? ? ?IMPRESSION / MDM / ASSESSMENT AND PLAN / ED COURSE  ?I reviewed the triage vital signs and the nursing notes. ? ?Patient presents to the emergency department for intermittent chest pain over the past several weeks.  Currently the patient appears well, denies any discomfort currently.  No shortness of breath.  Overall patient appears well, reassuring physical exam.  Reassuring EKG.  Vital signs show hypertension otherwise no concerning findings.  We will check labs including cardiac enzymes we will obtain a chest x-ray and continue to closely monitor.  Patient is agreeable to plan of care. ? ?Patient's work-up is reassuring.  Patient's lab work is largely within normal limits including negative troponin x2.  Normal chemistry.  Reassuring CBC.  Patient is EKG is reassuring.  Chest x-ray negative x2.  Given the patient's age and history I did consider admission to the hospital however given his reassuring work-up negative troponin x2 and  as he remains chest pain-free I believe the patient is safe for discharge home with cardiology follow-up for a stress test.  Patient agreeable to plan. ? ?FINAL CLINICAL IMPRESSION(S) / ED DIAGNOSES  ? ?Chest pain ? ?Rx / DC Orders  ? ?Cardiology follow-up ?PCP follow-up ? ?Note:  This document was prepared using Dragon voice recognition software and may include unintentional dictation errors. ?  Minna Antis, MD ?02/18/22 1035 ? ?

## 2022-02-18 NOTE — ED Notes (Signed)
EDP in room  

## 2022-03-22 NOTE — Progress Notes (Signed)
Cardiology Office Note ? ?Date:  03/23/2022  ? ?ID:  Joe [name], DOB 1961/08/31, MRN [id number] ? ?PCP:  Pcp, No  ? ?Chief Complaint  ?Patient presents with  ? Follow up [facility] ER; chest pain   ?  "Doing well." Medications reviewed by the patient verbally.   ? ? ?HPI:  ?[name] is a 61 year old gentleman with past medical history of ?Hypertension ?Smoker 1 ppd ?Prior mitral valve repair 2006, valvuloplasty/ring ?seen in the emergency room February 18, 2022 for chest pain ?Who presents by referral from the emergency room for evaluation of his chest pain symptoms ? ?On evaluation in the emergency room, last month ? past week or 2 he has been experiencing intermittent sharp pains in the left chest.  States they only last a very brief time and then go away. ?Work-up in the ER was negative ? ?Sharp pain started 6 months ago, ?On and off until the , less  ?None since 3/23 ? 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 ?Smoker: 1 ppd ? ?Father with heart issue ? ?EKG personally reviewed by myself on todays visit ?Normal sinus rhythm rate 83 bpm T wave abnormality V3 through V5, consider old inferior MI ? ?PMH:   has a past medical history of Hypertension. ? ?PSH:    ?Past Surgical History:  ?Procedure Laterality Date  ? ESOPHAGOGASTRODUODENOSCOPY (EGD) WITH PROPOFOL N/A 06/25/2018  ? Procedure: ESOPHAGOGASTRODUODENOSCOPY (EGD) WITH PROPOFOL;  Surgeon: Lin Landsman, MD;  Location: [facility] ENDOSCOPY;  Service: Gastroenterology;  Laterality: N/A;  ? MITRAL VALVE REPAIR    ? ? ?No current outpatient medications on file.  ? ?No current facility-administered medications for this visit.  ?Currently not taking medications ?Old medications on his list which he is not taking aspirin, lisinopril 2.5 daily, omeprazole 40 twice daily ? ?Allergies:   Patient has no known allergies.  ? ?Social History:  The patient  reports that he has been smoking cigarettes. He has a 30.00 pack-year smoking history. He has never used smokeless  tobacco. He reports current alcohol use. He reports that he does not use drugs.  ? ?Family History:   family history includes Heart failure in his father.  ? ? ?Review of Systems: ?Review of Systems  ?Constitutional: Negative.   ?HENT: Negative.    ?Respiratory: Negative.    ?Cardiovascular:  Positive for chest pain.  ?Gastrointestinal: Negative.   ?Musculoskeletal: Negative.   ?Neurological: Negative.   ?Psychiatric/Behavioral: Negative.    ?All other systems reviewed and are negative. ? ? ?PHYSICAL EXAM: ?VS:  BP 132/80 (BP Location: Right Arm, Patient Position: Sitting, Cuff Size: Normal)   Pulse 83   Ht 5\' 11"  (1.803 m)   Wt 191 lb 2 oz (86.7 kg)   SpO2 98%   BMI 26.66 kg/m?  , BMI Body mass index is 26.66 kg/m?. ?GEN: Well nourished, well developed, in no acute distress ?HEENT: normal ?Neck: no JVD, carotid bruits, or masses ?Cardiac: RRR; no murmurs, rubs, or gallops,no edema  ?Respiratory:  clear to auscultation bilaterally, normal work of breathing ?GI: soft, nontender, nondistended, + BS ?MS: no deformity or atrophy ?Skin: warm and dry, no rash ?Neuro:  Strength and sensation are intact ?Psych: euthymic mood, full affect ? ?Recent Labs: ?02/18/2022: BUN 14; Creatinine, Ser 1.18; Hemoglobin 15.8; Platelets 272; Potassium 3.5; Sodium 139  ? ? ?Lipid Panel ?Lab Results  ?Component Value Date  ? CHOL 184 03/14/2013  ? HDL 38 (L) 03/14/2013  ? LDLCALC 103 (H) 03/14/2013  ?  TRIG 214 (H) 03/14/2013  ? ? ?Wt Readings from Last 3 Encounters:  ?03/23/22 191 lb 2 oz (86.7 kg)  ?02/18/22 185 lb (83.9 kg)  ?08/05/20 180 lb (81.6 kg)  ?  ? ?ASSESSMENT AND PLAN: ? ?Problem List Items Addressed This Visit   ?None ?Visit Diagnoses   ? ? Chest pain of uncertain etiology    -  Primary  ? Relevant Orders  ? EKG 12-Lead  ? H/O mitral valve repair      ? Relevant Orders  ? EKG 12-Lead  ? Angina pectoris ([place])      ? Relevant Orders  ? EKG 12-Lead  ? ?  ? ?Chest pain ?Abnormal EKG, smoker 1 pack/day, strong family history  father died age 21 cardiac disease having stuttering chest pain over the past 6 months, seems to be lasting longer now up to 15 minutes at a time ?-Presenting from the emergency room, work-up there negative ?-Discussed treatment options with him, we have ordered a cardiac CTA for further evaluation ? ?Mitral valve repair 2006 ?He is concerned about the longevity of his valve repair ?Was told that only last 10 years ?Echocardiogram ordered at his request ? ? ? Total encounter time more than 60 minutes ? Greater than 50% was spent in counseling and coordination of care with the patient ? ? ? ?Signed, ?Esmond Plants, M.D., Ph.D. ?[facility] [place], [place] ?[phone] ?

## 2022-03-23 ENCOUNTER — Encounter: Payer: Self-pay | Admitting: Cardiovascular Disease

## 2022-03-23 ENCOUNTER — Ambulatory Visit: Payer: Federal, State, Local not specified - PPO | Admitting: Cardiovascular Disease

## 2022-03-23 VITALS — BP 132/80 | HR 83 | Ht 71.0 in | Wt 191.1 lb

## 2022-03-23 DIAGNOSIS — I209 Angina pectoris, unspecified: Secondary | ICD-10-CM

## 2022-03-23 DIAGNOSIS — Z01812 Encounter for preprocedural laboratory examination: Secondary | ICD-10-CM

## 2022-03-23 DIAGNOSIS — R072 Precordial pain: Secondary | ICD-10-CM

## 2022-03-23 DIAGNOSIS — Z9889 Other specified postprocedural states: Secondary | ICD-10-CM | POA: Diagnosis not present

## 2022-03-23 DIAGNOSIS — R079 Chest pain, unspecified: Secondary | ICD-10-CM | POA: Diagnosis not present

## 2022-03-23 MED ORDER — METOPROLOL TARTRATE 100 MG PO TABS: ORAL_TABLET | ORAL | 0 refills | Status: DC

## 2022-03-23 NOTE — Patient Instructions (Addendum)
Medication Instructions:  ?No changes ? ?If you need a refill on your cardiac medications before your next appointment, please call your pharmacy.  ? ? ?Lab work: ?- Your physician recommends that you return for lab work prior to your Cardiac CT scan (any day before 5/4) : BMP ? ?[facility] ?1st desk on the right to check in (REGISTRATION) ?Lab hours: Monday- Friday (7:30 am- 5:30 pm) ? ? ?Testing/Procedures: ? ?1) Echocardiogram: ?- Your physician has requested that you have an echocardiogram. Echocardiography is a painless test that uses sound waves to create images of your heart. It provides your doctor with information about the size and shape of your heart and how well your heart?s chambers and valves are working. This procedure takes approximately one hour. There are no restrictions for this procedure. There is a possibility that an IV may need to be started during your test to inject an image enhancing agent. This is done to obtain more optimal pictures of your heart. Therefore we ask that you do at least drink some water prior to coming in to hydrate your veins.  ? ? ? ?2) Cardiac CT angiogram:  Thursday 04/01/22 (arrive at 12:15 pm) ?- Your physician has requested that you have cardiac CT. Cardiac computed tomography (CT) is a painless test that uses an x-ray machine to take clear, detailed pictures of your heart. ? ? ?Your cardiac CT will be scheduled at:  ? ?[facility] ?[address] ?[address] ?[place], [place] [address] ?([phone] ? ? ?Please arrive 15 mins early for check-in and test prep. ? ?Please follow these instructions carefully (unless otherwise directed): ? ?Hold all erectile dysfunction medications at least 3 days (72 hrs) prior to test. ? ?On the Night Before the Test: ?Be sure to Drink plenty of water. ?Do not consume any caffeinated/decaffeinated beverages or chocolate 12 hours prior to your test. ?Do not take any antihistamines 12  hours prior to your test. ? ? ?On the Day of the Test: ?Drink plenty of water until 1 hour prior to the test. ?Do not eat any food 4 hours prior to the test. ?You may take your regular medications prior to the test.  ?Take metoprolol (Lopressor) 100 mg two hours prior to test x 1 dose ? ?     ?After the Test: ?Drink plenty of water. ?After receiving IV contrast, you may experience a mild flushed feeling. This is normal. ?On occasion, you may experience a mild rash up to 24 hours after the test. This is not dangerous. If this occurs, you can take Benadryl 25 mg and increase your fluid intake. ?If you experience trouble breathing, this can be serious. If it is severe call 911 IMMEDIATELY. If it is mild, please call our office. ? ? ?Some insurance companies will need an authorization prior to the service being performed.  ? ?For non-scheduling related questions, please contact the cardiac imaging nurse navigator should you have any questions/concerns: ?Rockwell Alexandria, Cardiac Imaging Nurse Navigator ?Larey Brick, Cardiac Imaging Nurse Navigator ?[facility] ?Direct Office Dial: [phone]  ? ?For scheduling needs, including cancellations and rescheduling, please call [place], [phone]. ? ? ?Follow-Up: ?At [facility], you and your health needs are our priority.  As part of our continuing mission to provide you with exceptional heart care, we have created designated Provider Care Teams.  These Care Teams include your primary Cardiologist (physician) and Advanced Practice Providers (APPs -  Physician Assistants and  Nurse Practitioners) who all work together to provide you with the care you need, when you need it. ? ?You will need a follow up appointment as needed pending the results of your cardiac testing ? ?Providers on your designated Care Team:   ?Nicolasa Ducking, NP ?Eula Listen, PA-C ?Cadence Fransico Michael, PA-C ? ?COVID-19 Vaccine Information can be found at:  [email or web address] For questions related to vaccine distribution or appointments, please email [email or web address] or call [phone].  ? ? ?Echocardiogram ?An echocardiogram is a test that uses sound waves (ultrasound) to produce images of the heart. ?Images from an echocardiogram can provide important information about: ?Heart size and shape. ?The size and thickness and movement of your heart's walls. ?Heart muscle function and strength. ?Heart valve function or if you have stenosis. Stenosis is when the heart valves are too narrow. ?If blood is flowing backward through the heart valves (regurgitation). ?A tumor or infectious growth around the heart valves. ?Areas of heart muscle that are not working well because of poor blood flow or injury from a heart attack. ?Aneurysm detection. An aneurysm is a weak or damaged part of an artery wall. The wall bulges out from the normal force of blood pumping through the body. ?Tell a health care provider about: ?Any allergies you have. ?All medicines you are taking, including vitamins, herbs, eye drops, creams, and over-the-counter medicines. ?Any blood disorders you have. ?Any surgeries you have had. ?Any medical conditions you have. ?Whether you are pregnant or may be pregnant. ?What are the risks? ?Generally, this is a safe test. However, problems may occur, including an allergic reaction to dye (contrast) that may be used during the test. ?What happens before the test? ?No specific preparation is needed. You may eat and drink normally. ?What happens during the test? ? ?You will take off your clothes from the waist up and put on a hospital gown. ?Electrodes or electrocardiogram (ECG)patches may be placed on your chest. The electrodes or patches are then connected to a device that monitors your heart rate and rhythm. ?You will lie down on a table for an ultrasound exam. A gel will be applied to your chest to  help sound waves pass through your skin. ?A handheld device, called a transducer, will be pressed against your chest and moved over your heart. The transducer produces sound waves that travel to your heart and bounce back (or "echo" back) to the transducer. These sound waves will be captured in real-time and changed into images of your heart that can be viewed on a video monitor. The images will be recorded on a computer and reviewed by your health care provider. ?You may be asked to change positions or hold your breath for a short time. This makes it easier to get different views or better views of your heart. ?In some cases, you may receive contrast through an IV in one of your veins. This can improve the quality of the pictures from your heart. ?The procedure may vary among health care providers and hospitals. ?What can I expect after the test? ?You may return to your normal, everyday life, including diet, activities, and medicines, unless your health care provider tells you not to do that. ?Follow these instructions at home: ?It is up to you to get the results of your test. Ask your health care provider, or the department that is doing the test, when your results will be ready. ?Keep all follow-up visits. This is important. ?Summary ?An echocardiogram is a test that uses sound waves (  ultrasound) to produce images of the heart. ?Images from an echocardiogram can provide important information about the size and shape of your heart, heart muscle function, heart valve function, and other possible heart problems. ?You do not need to do anything to prepare before this test. You may eat and drink normally. ?After the echocardiogram is completed, you may return to your normal, everyday life, unless your health care provider tells you not to do that. ?This information is not intended to replace advice given to you by your health care provider. Make sure you discuss any questions you have with your health care  provider. ?Document Revised: 07/29/2021 Document Reviewed: 07/08/2020 ?Elsevier Patient Education ? 2023 Elsevier Inc. ? ? ? ?Cardiac CT Angiogram ?A cardiac CT angiogram is a procedure to look at the heart and the area around the heart. It

## 2022-03-30 ENCOUNTER — Other Ambulatory Visit
Admission: RE | Admit: 2022-03-30 | Discharge: 2022-03-30 | Disposition: A | Discharge status: Home / Self Care | Payer: Federal, State, Local not specified - PPO | Attending: Cardiovascular Disease | Admitting: Cardiovascular Disease

## 2022-03-30 DIAGNOSIS — R072 Precordial pain: Secondary | ICD-10-CM | POA: Diagnosis not present

## 2022-03-30 DIAGNOSIS — Z01812 Encounter for preprocedural laboratory examination: Secondary | ICD-10-CM | POA: Insufficient documentation

## 2022-03-30 LAB — BASIC METABOLIC PANEL
Anion gap: 10 (ref 5–15)
BUN: 13 mg/dL (ref 8–23)
CO2: 25 mmol/L (ref 22–32)
Calcium: 9.3 mg/dL (ref 8.9–10.3)
Chloride: 103 mmol/L (ref 98–111)
Creatinine, Ser: 1.21 mg/dL (ref 0.61–1.24)
GFR, Estimated: >60 mL/min (ref >60)
Glucose, Bld: 99 mg/dL (ref 70–99)
Potassium: 3.7 mmol/L (ref 3.5–5.1)
Sodium: 138 mmol/L (ref 135–145)

## 2022-03-31 ENCOUNTER — Telehealth: Payer: Self-pay | Admitting: Emergency Medicine

## 2022-03-31 ENCOUNTER — Telehealth (HOSPITAL_COMMUNITY): Payer: Self-pay | Admitting: *Deleted

## 2022-03-31 NOTE — Telephone Encounter (Signed)
Reaching out to patient to offer assistance regarding upcoming cardiac imaging study; pt verbalizes understanding of appt date/time, parking situation and where to check in, pre-test NPO status and medications ordered, and verified current allergies; name and call back number provided for further questions should they arise ° °Posey Jasmin RN Navigator Cardiac Imaging °[facility] °[phone] office °[phone] cell ° °Patient to take 100mg metoprolol tartrate two hours prior to his cardiac CT scan. °

## 2022-03-31 NOTE — Telephone Encounter (Signed)
-----   Message from Antonieta Iba, MD sent at 03/30/2022  5:22 PM EDT ----- ?Lab work reviewed ?Normal basic metabolic panel ?

## 2022-03-31 NOTE — Telephone Encounter (Signed)
Called and spoke with patient. Results reviewed with patient, pt verbalized understanding,  questions (if any) answered.   ?

## 2022-04-01 ENCOUNTER — Ambulatory Visit
Admission: RE | Admit: 2022-04-01 | Discharge: 2022-04-01 | Disposition: A | Discharge status: Home / Self Care | Payer: Federal, State, Local not specified - PPO | Source: Ambulatory Visit | Attending: Cardiovascular Disease | Admitting: Cardiovascular Disease

## 2022-04-01 ENCOUNTER — Other Ambulatory Visit: Payer: Self-pay

## 2022-04-01 ENCOUNTER — Other Ambulatory Visit: Payer: Self-pay | Admitting: Cardiovascular Disease

## 2022-04-01 DIAGNOSIS — I2510 Atherosclerotic heart disease of native coronary artery without angina pectoris: Secondary | ICD-10-CM | POA: Diagnosis not present

## 2022-04-01 DIAGNOSIS — R931 Abnormal findings on diagnostic imaging of heart and coronary circulation: Secondary | ICD-10-CM | POA: Diagnosis not present

## 2022-04-01 DIAGNOSIS — R072 Precordial pain: Secondary | ICD-10-CM | POA: Diagnosis not present

## 2022-04-01 MED ORDER — IOHEXOL 350 MG/ML SOLN
75.0000 mL | Freq: Once | INTRAVENOUS | Status: AC | PRN
  Administered 2022-04-01: 75 mL via INTRAVENOUS

## 2022-04-01 MED ORDER — NITROGLYCERIN 0.4 MG SL SUBL
0.8000 mg | SUBLINGUAL_TABLET | Freq: Once | SUBLINGUAL | Status: AC
  Administered 2022-04-01: 0.8 mg via SUBLINGUAL

## 2022-04-01 NOTE — Progress Notes (Signed)
Patient tolerated procedure well however, he did have a small clear liquid emesis after sitting up. Then stated he was fine. Ambulate w/o difficulty. Denies light headedness or being dizzy. Sitting in chair drinking water provided. Encouraged to drink extra water today and reasoning explained. Verbalized understanding. All questions answered. ABC intact. No further needs. Discharge from procedure area w/o issues.   ?

## 2022-04-02 DIAGNOSIS — R931 Abnormal findings on diagnostic imaging of heart and coronary circulation: Secondary | ICD-10-CM | POA: Diagnosis not present

## 2022-04-02 DIAGNOSIS — I2510 Atherosclerotic heart disease of native coronary artery without angina pectoris: Secondary | ICD-10-CM | POA: Diagnosis not present

## 2022-04-05 ENCOUNTER — Telehealth: Payer: Self-pay | Admitting: Emergency Medicine

## 2022-04-05 NOTE — Telephone Encounter (Signed)
Called patient to go over results. No answer. Lmtcb. 

## 2022-04-05 NOTE — Telephone Encounter (Signed)
Joe Iba, MD  Vergia Alberts, RN ?CT FFR results reviewed  ?Decrease in FFR in the LAD but did not meet clinical significance less than 0.8 to warrant severe stenosis  ?Previously felt to have severe stenosis of LAD  ?We can discuss on office visit to review results  ?

## 2022-04-05 NOTE — Telephone Encounter (Signed)
Called and spoke with patient. Reviewed results, questions, if any, answered.  ? 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 Explained that Dr. Rockey Situ is not in the office on Thursdays. No APP with available appointments, but will see if Ignacia Bayley, NP can see h im before or after echo, or add him to wait list for Christell Faith, PAs schedule.  ? ?Pt verbalized understanding, voiced appreciation for the call.  ?

## 2022-04-05 NOTE — Telephone Encounter (Signed)
-----   Message from Antonieta Iba, MD sent at 04/02/2022  7:46 AM EDT ----- ?Cardiac CTA ?There is concern for severe stenosis in the LAD estimated greater than 70% ?Recommendation made for cardiac catheterization ?Would recommend a close clinic follow-up visit to discuss results and potentially schedule cardiac catheterization ? ?

## 2022-04-06 NOTE — Telephone Encounter (Signed)
Per sarah ok to add on [place] at 3 after echo.  ?

## 2022-04-08 ENCOUNTER — Ambulatory Visit: Payer: Federal, State, Local not specified - PPO | Admitting: Cardiovascular Disease

## 2022-04-08 ENCOUNTER — Ambulatory Visit (INDEPENDENT_AMBULATORY_CARE_PROVIDER_SITE_OTHER): Payer: Federal, State, Local not specified - PPO

## 2022-04-08 ENCOUNTER — Other Ambulatory Visit
Admission: RE | Admit: 2022-04-08 | Discharge: 2022-04-08 | Disposition: A | Discharge status: Home / Self Care | Payer: Federal, State, Local not specified - PPO | Attending: Cardiovascular Disease | Admitting: Cardiovascular Disease

## 2022-04-08 ENCOUNTER — Encounter: Payer: Self-pay | Admitting: Cardiovascular Disease

## 2022-04-08 ENCOUNTER — Other Ambulatory Visit: Payer: Self-pay | Admitting: Cardiovascular Disease

## 2022-04-08 VITALS — BP 130/84 | HR 90 | Ht 71.5 in | Wt 188.0 lb

## 2022-04-08 DIAGNOSIS — E782 Mixed hyperlipidemia: Secondary | ICD-10-CM | POA: Diagnosis not present

## 2022-04-08 DIAGNOSIS — R931 Abnormal findings on diagnostic imaging of heart and coronary circulation: Secondary | ICD-10-CM

## 2022-04-08 DIAGNOSIS — F17200 Nicotine dependence, unspecified, uncomplicated: Secondary | ICD-10-CM

## 2022-04-08 DIAGNOSIS — Z9889 Other specified postprocedural states: Secondary | ICD-10-CM

## 2022-04-08 DIAGNOSIS — I25118 Atherosclerotic heart disease of native coronary artery with other forms of angina pectoris: Secondary | ICD-10-CM

## 2022-04-08 DIAGNOSIS — Z954 Presence of other heart-valve replacement: Secondary | ICD-10-CM | POA: Diagnosis not present

## 2022-04-08 DIAGNOSIS — I255 Ischemic cardiomyopathy: Secondary | ICD-10-CM

## 2022-04-08 DIAGNOSIS — R9389 Abnormal findings on diagnostic imaging of other specified body structures: Secondary | ICD-10-CM | POA: Insufficient documentation

## 2022-04-08 DIAGNOSIS — I200 Unstable angina: Secondary | ICD-10-CM | POA: Insufficient documentation

## 2022-04-08 LAB — CBC WITH DIFFERENTIAL/PLATELET
Abs Immature Granulocytes: 0.05 10*3/uL (ref 0.00–0.07)
Basophils Absolute: 0.1 10*3/uL (ref 0.0–0.1)
Basophils Relative: 1 %
Eosinophils Absolute: 0.2 10*3/uL (ref 0.0–0.5)
Eosinophils Relative: 2 %
HCT: 47.7 % (ref 39.0–52.0)
Hemoglobin: 16.1 g/dL (ref 13.0–17.0)
Immature Granulocytes: 1 %
Lymphocytes Relative: 28 %
Lymphs Abs: 2.7 10*3/uL (ref 0.7–4.0)
MCH: 30.8 pg (ref 26.0–34.0)
MCHC: 33.8 g/dL (ref 30.0–36.0)
MCV: 91.4 fL (ref 80.0–100.0)
Monocytes Absolute: 0.8 10*3/uL (ref 0.1–1.0)
Monocytes Relative: 8 %
Neutro Abs: 5.8 10*3/uL (ref 1.7–7.7)
Neutrophils Relative %: 60 %
Platelets: 241 10*3/uL (ref 150–400)
RBC: 5.22 MIL/uL (ref 4.22–5.81)
RDW: 12.6 % (ref 11.5–15.5)
WBC: 9.7 10*3/uL (ref 4.0–10.5)
nRBC: 0 % (ref 0.0–0.2)

## 2022-04-08 LAB — ECHOCARDIOGRAM COMPLETE
AR max vel: 2.78 cm2
AV Area VTI: 2.55 cm2
AV Area mean vel: 2.54 cm2
AV Mean grad: 2 mmHg
AV Peak grad: 3.6 mmHg
Ao pk vel: 0.94 m/s
Area-P 1/2: 4.12 cm2
Calc EF: 53.9 %
Single Plane A2C EF: 49.1 %
Single Plane A4C EF: 58.1 %

## 2022-04-08 LAB — BASIC METABOLIC PANEL
Anion gap: 6 (ref 5–15)
BUN: 12 mg/dL (ref 8–23)
CO2: 27 mmol/L (ref 22–32)
Calcium: 9.2 mg/dL (ref 8.9–10.3)
Chloride: 102 mmol/L (ref 98–111)
Creatinine, Ser: 1.15 mg/dL (ref 0.61–1.24)
GFR, Estimated: >60 mL/min (ref >60)
Glucose, Bld: 91 mg/dL (ref 70–99)
Potassium: 3.8 mmol/L (ref 3.5–5.1)
Sodium: 135 mmol/L (ref 135–145)

## 2022-04-08 MED ORDER — LOSARTAN POTASSIUM 25 MG PO TABS: 25.0000 mg | ORAL_TABLET | Freq: Every day | ORAL | 3 refills | Status: DC

## 2022-04-08 MED ORDER — CARVEDILOL 3.125 MG PO TABS: 3.1250 mg | ORAL_TABLET | Freq: Two times a day (BID) | ORAL | 3 refills | Status: DC

## 2022-04-08 MED ORDER — SODIUM CHLORIDE 0.9% FLUSH: 3.0000 mL | Freq: Two times a day (BID) | INTRAVENOUS | Status: AC

## 2022-04-08 MED ORDER — ROSUVASTATIN CALCIUM 10 MG PO TABS: 10.0000 mg | ORAL_TABLET | Freq: Every day | ORAL | 3 refills | Status: DC

## 2022-04-08 MED ORDER — ASPIRIN EC 81 MG PO TBEC: 81.0000 mg | DELAYED_RELEASE_TABLET | Freq: Every day | ORAL | 3 refills | Status: DC

## 2022-04-08 NOTE — Progress Notes (Signed)
Cardiology Office Note ? ?Date:  04/08/2022  ? ?ID:  Joe [name], DOB [birth year], MRN [id number] ? ?PCP:  Pcp, No  ? ?Chief Complaint  ?Patient presents with  ? OTher  ?  Follow up post ECHO and CT. Meds reviewed verbally with patient.   ? ? ?HPI:  ?[name] is a 61 year old gentleman with past medical history of ?Hypertension ?Smoker 1 ppd ?Prior mitral valve repair 2006, valvuloplasty/ring ?seen in the emergency room February 18, 2022 for chest pain ?Works at post office ?Who presents for follow-up of his chest pain symptoms and cardiac CTA and echo results ? ?Last seen by myself in the clinic March 23, 2022, cardiac CTA was ordered for chest pain symptoms March 2023 ? ?Cardiac CTA Apr 01, 2022 ?Results reviewed with him in detail today ?1. Coronary calcium score of 125. This was 66th percentile for age ?and sex matched control. ?  ?2. Normal coronary origin with right dominance. ?  ?3. Non calcified plaque in the proximal-mid LAD causing severe ?stenosis (>70%). ?  ?4. Calcified plaque in the proximal RCA causing minimal stenosis. ?  ?5. CAD-RADS 4 Severe stenosis. Cardiac catheterization is ?recommended. Consider symptom-guided anti-ischemic pharmacotherapy ?as well as risk factor modification per guideline directed care. ? ?FFR analysis did not note severe stenosis ? ?Echocardiogram prelim echo images reviewed ?Global hypokinesis with more notable hypokinesis of the anterior, anteroseptal and apical region ? ?Since his last clinic visit reports no further episodes of chest pain ?Active, works for the post office ?No regular exercise program, does work in his garden ?Smoker 1 pack/day ? ?Strong family history, father with cardiac disease ? ? ? ?PMH:   has a past medical history of Hypertension. ? ?PSH:    ?Past Surgical History:  ?Procedure Laterality Date  ? ESOPHAGOGASTRODUODENOSCOPY (EGD) WITH PROPOFOL N/A 06/25/2018  ? Procedure: ESOPHAGOGASTRODUODENOSCOPY (EGD) WITH PROPOFOL;  Surgeon: Toney Reil,  MD;  Location: [facility] ENDOSCOPY;  Service: Gastroenterology;  Laterality: N/A;  ? MITRAL VALVE REPAIR    ? ?No current outpatient medications on file prior to visit.  ? ?No current facility-administered medications on file prior to visit.  ? ? ?Currently not taking medications ?Old medications on his list which he is not taking aspirin, lisinopril 2.5 daily, omeprazole 40 twice daily ? ?Allergies:   Patient has no known allergies.  ? ?Social History:  The patient  reports that he has been smoking cigarettes. He has a 30.00 pack-year smoking history. He has never used smokeless tobacco. He reports current alcohol use. He reports that he does not use drugs.  ? ?Family History:   family history includes Heart failure in his father.  ? ? ?Review of Systems: ?Review of Systems  ?Constitutional: Negative.   ?HENT: Negative.    ?Respiratory: Negative.    ?Cardiovascular:  Positive for chest pain.  ?Gastrointestinal: Negative.   ?Musculoskeletal: Negative.   ?Neurological: Negative.   ?Psychiatric/Behavioral: Negative.    ?All other systems reviewed and are negative. ? ? ?PHYSICAL EXAM: ?VS:  BP 130/84 (BP Location: Left Arm, Patient Position: Sitting, Cuff Size: Normal)   Pulse 90   Ht 5' 11.5" (1.816 m)   Wt 188 lb (85.3 kg)   SpO2 98%   BMI 25.86 kg/m?  , BMI Body mass index is 25.86 kg/m?Marland Kitchen ?Constitutional:  oriented to person, place, and time. No distress.  ?HENT:  ?Head: Grossly normal ?Eyes:  no discharge. No scleral icterus.  ?Neck: No JVD, no carotid bruits  ?Cardiovascular: Regular rate  and rhythm, no murmurs appreciated ?Pulmonary/Chest: Clear to auscultation bilaterally, no wheezes or rails ?Abdominal: Soft.  no distension.  no tenderness.  ?Musculoskeletal: Normal range of motion ?Neurological:  normal muscle tone. Coordination normal. No atrophy ?Skin: Skin warm and dry ?Psychiatric: normal affect, pleasant ? ?Recent Labs: ?02/18/2022: Hemoglobin 15.8; Platelets 272 ?03/30/2022: BUN 13; Creatinine, Ser 1.21;  Potassium 3.7; Sodium 138  ? ? ?Lipid Panel ?Lab Results  ?Component Value Date  ? CHOL 184 03/14/2013  ? HDL 38 (L) 03/14/2013  ? LDLCALC 103 (H) 03/14/2013  ? TRIG 214 (H) 03/14/2013  ? ? ?Wt Readings from Last 3 Encounters:  ?04/08/22 188 lb (85.3 kg)  ?03/23/22 191 lb 2 oz (86.7 kg)  ?02/18/22 185 lb (83.9 kg)  ?  ? ?ASSESSMENT AND PLAN: ? ?Problem List Items Addressed This Visit   ?None ?Visit Diagnoses   ? ? Coronary artery disease of native artery of native heart with stable angina pectoris (HCC)    -  Primary  ? Relevant Medications  ? aspirin EC 81 MG tablet  ? carvedilol (COREG) 3.125 MG tablet  ? losartan (COZAAR) 25 MG tablet  ? rosuvastatin (CRESTOR) 10 MG tablet  ? Abnormal cardiac CT angiography      ? Smoker      ? Mixed hyperlipidemia      ? Relevant Medications  ? aspirin EC 81 MG tablet  ? carvedilol (COREG) 3.125 MG tablet  ? losartan (COZAAR) 25 MG tablet  ? rosuvastatin (CRESTOR) 10 MG tablet  ? Ischemic cardiomyopathy      ? Relevant Medications  ? aspirin EC 81 MG tablet  ? carvedilol (COREG) 3.125 MG tablet  ? losartan (COZAAR) 25 MG tablet  ? rosuvastatin (CRESTOR) 10 MG tablet  ? ?  ?Coronary disease with stable angina ?Discussed cardiac CTA results showing significant stenosis of the LAD proximal to mid region ?Echocardiogram with anterior wall hypokinesis concerning for LA distribution disease ?Discussed first treatment options with him, he is interested in cardiac catheterization ?I have reviewed the risks, indications, and alternatives to cardiac catheterization, possible angioplasty, and stenting with the patient. Risks include but are not limited to bleeding, infection, vascular injury, stroke, myocardial infection, arrhythmia, kidney injury, radiation-related injury in the case of prolonged fluoroscopy use, emergency cardiac surgery, and death. The patient understands the risks of serious complication is 1-2 in 1000 with diagnostic cardiac cath and 1-2% or less with  angioplasty/stenting.  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 ?Mitral valve repair 2006 ?Functioning well on echocardiogram ?There is moderate eccentric to septal wall regurgitation ?Possibly exacerbated by low ejection fraction and wall motion abnormality, ischemia as detailed above ? ?Dilated ischemic cardiomyopathy ?Mild global hypokinesis, anterior, anteroseptal and apical region with moderate hypokinesis concerning for ischemia ?Catheterization scheduled as above ?On aspirin, statin, beta-blocker, ARB, started today ? ? ? Total encounter time more than 50 minutes ? Greater than 50% was spent in counseling and coordination of care with the patient ? ? ? ?Signed, ?Dossie Arbour, M.D., Ph.D. ?[facility] [place], [place] ?[phone] ?

## 2022-04-08 NOTE — Patient Instructions (Addendum)
Cardiac cath next Thursday Dr. Ellyn Hack ? ?Medication Instructions:  ? ?Your physician has recommended you make the following change in your medication:  ?START aspirin 81 mg daily ?2. START coreg 3.125 mg twice a day ?3. START Losartan 25 mg daily ?4. START Crestor 10 mg daily ? ?If you need a refill on your cardiac medications before your next appointment, please call your pharmacy.  ? ?Lab work: ? ?None Ordered ? ?Testing/Procedures: ? ?  ?[place] ?[facility] ?[address] ?[place] [address] ?Dept: [phone] ?LocMN:[id number] ? ? ?You are scheduled for a Cardiac Catheterization on Thursday, May 18 with Dr. Glenetta Hew @ 12:00pm ? ? ?Special note: Every effort is made to have your procedure done on time. Please understand that emergencies sometimes delay scheduled procedures. ? ?2. Diet: Do not eat solid foods after midnight.  You may have clear liquids until 5 AM upon the day of the procedure. ? ? ?On the morning of your procedure, take Aspirin with a safe sip of water. ? ? - Plan to go home the same day, you will only stay overnight if medically necessary. ?-  You MUST have a responsible adult to drive you home. ?- An adult MUST be with you the first 24 hours after you arrive home. ?- Bring a current list of your medications, and the last time and date medication taken. ?-  Bring ID and current insurance cards. ?- Please wear clothes that are easy to get on and off and wear slip-on shoes. ? ?Thank you for allowing [place] to care for you! ?  -- [facility] ? ? ?Follow-Up: ?At [facility], you and your health needs are our priority.  As part of our continuing mission to provide you with exceptional heart care, we have created designated Provider Care Teams.  These Care Teams include your primary Cardiologist (physician) and Advanced Practice Providers (APPs -  Physician Assistants and  Nurse Practitioners) who all work together to provide you with the care you need, when you need it. ? ?You will need a follow up appointment in 1 month ? ?Providers on your designated Care Team:   ?Murray Hodgkins, NP ?Christell Faith, PA-C ?Cadence Kathlen Mody, PA-C ? ?COVID-19 Vaccine Information can be found at: [email or web address] For questions related to vaccine distribution or appointments, please email [email or web address] or call [phone].  ? ?

## 2022-04-08 NOTE — Addendum Note (Signed)
Addended by: Margrett Rud on: 04/08/2022 03:42 PM ? ? Modules accepted: Orders ? ?

## 2022-04-08 NOTE — H&P (View-Only) (Signed)
Cardiology Office Note ? ?Date:  04/08/2022  ? ?ID:  Laquinton A Bacigalupo, DOB 09/02/1961, MRN [id number] ? ?PCP:  Pcp, No  ? ?Chief Complaint  ?Patient presents with  ? OTher  ?  Follow up post ECHO and CT. Meds reviewed verbally with patient.   ? ? ?HPI:  ?[name] is a 61-year-old gentleman with past medical history of ?Hypertension ?Smoker 1 ppd ?Prior mitral valve repair 2006, valvuloplasty/ring ?seen in the emergency room February 18, 2022 for chest pain ?Works at post [employer] ?Who presents for follow-up of his chest pain symptoms and cardiac CTA and echo results ? ?Last seen by myself in the clinic March 23, 2022, cardiac CTA was ordered for chest pain symptoms March 2023 ? ?Cardiac CTA Apr 01, 2022 ?Results reviewed with him in detail today ?1. Coronary calcium score of 125. This was 66th percentile for age ?and sex matched control. ?  ?2. Normal coronary origin with right dominance. ?  ?3. Non calcified plaque in the proximal-mid LAD causing severe ?stenosis (>70%). ?  ?4. Calcified plaque in the proximal RCA causing minimal stenosis. ?  ?5. CAD-RADS 4 Severe stenosis. Cardiac catheterization is ?recommended. Consider symptom-guided anti-ischemic pharmacotherapy ?as well as risk factor modification per guideline directed care. ? ?FFR analysis did not note severe stenosis ? ?Echocardiogram prelim echo images reviewed ?Global hypokinesis with more notable hypokinesis of the anterior, anteroseptal and apical region ? ?Since his last clinic visit reports no further episodes of chest pain ?Active, works for the post office ?No regular exercise program, does work in his garden ?Smoker 1 pack/day ? ?Strong family history, father with cardiac disease ? ? ? ?PMH:   has a past medical history of Hypertension. ? ?PSH:    ?Past Surgical History:  ?Procedure Laterality Date  ? ESOPHAGOGASTRODUODENOSCOPY (EGD) WITH PROPOFOL N/A 06/25/2018  ? Procedure: ESOPHAGOGASTRODUODENOSCOPY (EGD) WITH PROPOFOL;  Surgeon: Vanga, Rohini Reddy,  MD;  Location: [facility] ENDOSCOPY;  Service: Gastroenterology;  Laterality: N/A;  ? MITRAL VALVE REPAIR    ? ?No current outpatient medications on file prior to visit.  ? ?No current facility-administered medications on file prior to visit.  ? ? ?Currently not taking medications ?Old medications on his list which he is not taking aspirin, lisinopril 2.5 daily, omeprazole 40 twice daily ? ?Allergies:   Patient has no known allergies.  ? ?Social History:  The patient  reports that he has been smoking cigarettes. He has a 30.00 pack-year smoking history. He has never used smokeless tobacco. He reports current alcohol use. He reports that he does not use drugs.  ? ?Family History:   family history includes Heart failure in his father.  ? ? ?Review of Systems: ?Review of Systems  ?Constitutional: Negative.   ?HENT: Negative.    ?Respiratory: Negative.    ?Cardiovascular:  Positive for chest pain.  ?Gastrointestinal: Negative.   ?Musculoskeletal: Negative.   ?Neurological: Negative.   ?Psychiatric/Behavioral: Negative.    ?All other systems reviewed and are negative. ? ? ?PHYSICAL EXAM: ?VS:  BP 130/84 (BP Location: Left Arm, Patient Position: Sitting, Cuff Size: Normal)   Pulse 90   Ht 5' 11.5" (1.816 m)   Wt 188 lb (85.3 kg)   SpO2 98%   BMI 25.86 kg/m?  , BMI Body mass index is 25.86 kg/m?. ?Constitutional:  oriented to person, place, and time. No distress.  ?HENT:  ?Head: Grossly normal ?Eyes:  no discharge. No scleral icterus.  ?Neck: No JVD, no carotid bruits  ?Cardiovascular: Regular rate   and rhythm, no murmurs appreciated ?Pulmonary/Chest: Clear to auscultation bilaterally, no wheezes or rails ?Abdominal: Soft.  no distension.  no tenderness.  ?Musculoskeletal: Normal range of motion ?Neurological:  normal muscle tone. Coordination normal. No atrophy ?Skin: Skin warm and dry ?Psychiatric: normal affect, pleasant ? ?Recent Labs: ?02/18/2022: Hemoglobin 15.8; Platelets 272 ?03/30/2022: BUN 13; Creatinine, Ser 1.21;  Potassium 3.7; Sodium 138  ? ? ?Lipid Panel ?Lab Results  ?Component Value Date  ? CHOL 184 03/14/2013  ? HDL 38 (L) 03/14/2013  ? LDLCALC 103 (H) 03/14/2013  ? TRIG 214 (H) 03/14/2013  ? ? ?Wt Readings from Last 3 Encounters:  ?04/08/22 188 lb (85.3 kg)  ?03/23/22 191 lb 2 oz (86.7 kg)  ?02/18/22 185 lb (83.9 kg)  ?  ? ?ASSESSMENT AND PLAN: ? ?Problem List Items Addressed This Visit   ?None ?Visit Diagnoses   ? ? Coronary artery disease of native artery of native heart with stable angina pectoris (HCC)    -  Primary  ? Relevant Medications  ? aspirin EC 81 MG tablet  ? carvedilol (COREG) 3.125 MG tablet  ? losartan (COZAAR) 25 MG tablet  ? rosuvastatin (CRESTOR) 10 MG tablet  ? Abnormal cardiac CT angiography      ? Smoker      ? Mixed hyperlipidemia      ? Relevant Medications  ? aspirin EC 81 MG tablet  ? carvedilol (COREG) 3.125 MG tablet  ? losartan (COZAAR) 25 MG tablet  ? rosuvastatin (CRESTOR) 10 MG tablet  ? Ischemic cardiomyopathy      ? Relevant Medications  ? aspirin EC 81 MG tablet  ? carvedilol (COREG) 3.125 MG tablet  ? losartan (COZAAR) 25 MG tablet  ? rosuvastatin (CRESTOR) 10 MG tablet  ? ?  ?Coronary disease with stable angina ?Discussed cardiac CTA results showing significant stenosis of the LAD proximal to mid region ?Echocardiogram with anterior wall hypokinesis concerning for LA distribution disease ?Discussed first treatment options with him, he is interested in cardiac catheterization ?I have reviewed the risks, indications, and alternatives to cardiac catheterization, possible angioplasty, and stenting with the patient. Risks include but are not limited to bleeding, infection, vascular injury, stroke, myocardial infection, arrhythmia, kidney injury, radiation-related injury in the case of prolonged fluoroscopy use, emergency cardiac surgery, and death. The patient understands the risks of serious complication is 1-2 in 1000 with diagnostic cardiac cath and 1-2% or less with  angioplasty/stenting.  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 ?Mitral valve repair 2006 ?Functioning well on echocardiogram ?There is moderate eccentric to septal wall regurgitation ?Possibly exacerbated by low ejection fraction and wall motion abnormality, ischemia as detailed above ? ?Dilated ischemic cardiomyopathy ?Mild global hypokinesis, anterior, anteroseptal and apical region with moderate hypokinesis concerning for ischemia ?Catheterization scheduled as above ?On aspirin, statin, beta-blocker, ARB, started today ? ? ? Total encounter time more than 50 minutes ? Greater than 50% was spent in counseling and coordination of care with the patient ? ? ? ?Signed, ?Tim Nolita Kutter, M.D., Ph.D. ?[facility], [place] ?[phone] ?

## 2022-04-13 ENCOUNTER — Telehealth: Payer: Self-pay | Admitting: Emergency Medicine

## 2022-04-13 NOTE — Telephone Encounter (Signed)
-----   Message from Antonieta Iba, MD sent at 04/11/2022 12:17 PM EDT ----- ?Normal labs ?

## 2022-04-13 NOTE — Telephone Encounter (Signed)
Called and spoke with patient. Results reviewed with patient, pt verbalized understanding,  questions (if any) answered.   ?

## 2022-04-15 ENCOUNTER — Ambulatory Visit
Admission: RE | Admit: 2022-04-15 | Discharge: 2022-04-15 | Disposition: A | Discharge status: Home / Self Care | Payer: Federal, State, Local not specified - PPO | Attending: Cardiology | Admitting: Cardiology

## 2022-04-15 ENCOUNTER — Encounter: Payer: Self-pay | Admitting: Cardiology

## 2022-04-15 ENCOUNTER — Encounter
Admission: RE | Disposition: A | Discharge status: Home / Self Care | Payer: Federal, State, Local not specified - PPO | Source: Home / Self Care | Attending: Cardiology

## 2022-04-15 ENCOUNTER — Other Ambulatory Visit: Payer: Self-pay

## 2022-04-15 DIAGNOSIS — I10 Essential (primary) hypertension: Secondary | ICD-10-CM | POA: Diagnosis not present

## 2022-04-15 DIAGNOSIS — E782 Mixed hyperlipidemia: Secondary | ICD-10-CM | POA: Insufficient documentation

## 2022-04-15 DIAGNOSIS — I200 Unstable angina: Secondary | ICD-10-CM | POA: Diagnosis not present

## 2022-04-15 DIAGNOSIS — F17210 Nicotine dependence, cigarettes, uncomplicated: Secondary | ICD-10-CM | POA: Diagnosis not present

## 2022-04-15 DIAGNOSIS — R9389 Abnormal findings on diagnostic imaging of other specified body structures: Secondary | ICD-10-CM | POA: Diagnosis not present

## 2022-04-15 DIAGNOSIS — I255 Ischemic cardiomyopathy: Secondary | ICD-10-CM | POA: Diagnosis not present

## 2022-04-15 DIAGNOSIS — I25118 Atherosclerotic heart disease of native coronary artery with other forms of angina pectoris: Secondary | ICD-10-CM | POA: Diagnosis not present

## 2022-04-15 DIAGNOSIS — Z79899 Other long term (current) drug therapy: Secondary | ICD-10-CM | POA: Diagnosis not present

## 2022-04-15 HISTORY — PX: LEFT HEART CATH AND CORONARY ANGIOGRAPHY: CATH118249

## 2022-04-15 SURGERY — LEFT HEART CATH AND CORONARY ANGIOGRAPHY
Anesthesia: Moderate Sedation | Laterality: Left

## 2022-04-15 SURGERY — LEFT HEART CATH AND CORONARY ANGIOGRAPHY
Anesthesia: Moderate Sedation

## 2022-04-15 MED ORDER — SODIUM CHLORIDE 0.9 % WEIGHT BASED INFUSION: 1.0000 mL/kg/h | INTRAVENOUS | Status: DC

## 2022-04-15 MED ORDER — HEPARIN SODIUM (PORCINE) 1000 UNIT/ML IJ SOLN
INTRAMUSCULAR | Status: DC | PRN
  Administered 2022-04-15: 4500 [IU] via INTRAVENOUS

## 2022-04-15 MED ORDER — MIDAZOLAM HCL 2 MG/2ML IJ SOLN
INTRAMUSCULAR | Status: AC
  Filled 2022-04-15: qty 2

## 2022-04-15 MED ORDER — HEPARIN SODIUM (PORCINE) 1000 UNIT/ML IJ SOLN
INTRAMUSCULAR | Status: AC
  Filled 2022-04-15: qty 10

## 2022-04-15 MED ORDER — NITROGLYCERIN 1 MG/10 ML FOR IR/CATH LAB
INTRA_ARTERIAL | Status: AC
  Filled 2022-04-15: qty 10

## 2022-04-15 MED ORDER — SODIUM CHLORIDE 0.9 % IV SOLN: 250.0000 mL | INTRAVENOUS | Status: DC | PRN

## 2022-04-15 MED ORDER — SODIUM CHLORIDE 0.9% FLUSH: 3.0000 mL | INTRAVENOUS | Status: DC | PRN

## 2022-04-15 MED ORDER — LIDOCAINE HCL (PF) 1 % IJ SOLN
INTRAMUSCULAR | Status: DC | PRN
  Administered 2022-04-15: 5 mL

## 2022-04-15 MED ORDER — HEPARIN (PORCINE) IN NACL 2000-0.9 UNIT/L-% IV SOLN
INTRAVENOUS | Status: DC | PRN
  Administered 2022-04-15: 1000 mL

## 2022-04-15 MED ORDER — LIDOCAINE HCL 1 % IJ SOLN
INTRAMUSCULAR | Status: AC
  Filled 2022-04-15: qty 20

## 2022-04-15 MED ORDER — NITROGLYCERIN 1 MG/10 ML FOR IR/CATH LAB
INTRA_ARTERIAL | Status: DC | PRN
  Administered 2022-04-15: 200 ug via INTRACORONARY

## 2022-04-15 MED ORDER — VERAPAMIL HCL 2.5 MG/ML IV SOLN
INTRAVENOUS | Status: AC
  Filled 2022-04-15: qty 2

## 2022-04-15 MED ORDER — VERAPAMIL HCL 2.5 MG/ML IV SOLN
INTRAVENOUS | Status: DC | PRN
Start: 2022-04-15 — End: 2022-04-15
  Administered 2022-04-15: 2.5 mg via INTRA_ARTERIAL

## 2022-04-15 MED ORDER — ISOSORBIDE MONONITRATE ER 30 MG PO TB24: 30.0000 mg | ORAL_TABLET | Freq: Every day | ORAL | 3 refills | Status: DC

## 2022-04-15 MED ORDER — FENTANYL CITRATE (PF) 100 MCG/2ML IJ SOLN
INTRAMUSCULAR | Status: AC
  Filled 2022-04-15: qty 2

## 2022-04-15 MED ORDER — HEPARIN (PORCINE) IN NACL 1000-0.9 UT/500ML-% IV SOLN
INTRAVENOUS | Status: AC
  Filled 2022-04-15: qty 1000

## 2022-04-15 MED ORDER — SODIUM CHLORIDE 0.9 % WEIGHT BASED INFUSION
3.0000 mL/kg/h | INTRAVENOUS | Status: AC
  Administered 2022-04-15: 3 mL/kg/h via INTRAVENOUS

## 2022-04-15 MED ORDER — ASPIRIN 81 MG PO CHEW: 81.0000 mg | CHEWABLE_TABLET | ORAL | Status: DC

## 2022-04-15 MED ORDER — IOHEXOL 300 MG/ML  SOLN
INTRAMUSCULAR | Status: DC | PRN
  Administered 2022-04-15: 80 mL

## 2022-04-15 MED ORDER — MIDAZOLAM HCL 2 MG/2ML IJ SOLN
INTRAMUSCULAR | Status: DC | PRN
  Administered 2022-04-15: 2 mg via INTRAVENOUS

## 2022-04-15 MED ORDER — FENTANYL CITRATE (PF) 100 MCG/2ML IJ SOLN
INTRAMUSCULAR | Status: DC | PRN
  Administered 2022-04-15: 25 ug via INTRAVENOUS

## 2022-04-15 SURGICAL SUPPLY — 11 items
CATH 5F 110X4 TIG (CATHETERS) ×1 IMPLANT
DEVICE RAD TR BAND REGULAR (VASCULAR PRODUCTS) ×1 IMPLANT
DRAPE BRACHIAL (DRAPES) ×1 IMPLANT
GLIDESHEATH SLEND SS 6F .021 (SHEATH) ×1 IMPLANT
GUIDEWIRE INQWIRE 1.5J.035X260 (WIRE) IMPLANT
INQWIRE 1.5J .035X260CM (WIRE) ×2
PACK CARDIAC CATH (CUSTOM PROCEDURE TRAY) ×2 IMPLANT
PROTECTION STATION PRESSURIZED (MISCELLANEOUS) ×2
SET ATX SIMPLICITY (MISCELLANEOUS) ×1 IMPLANT
STATION PROTECTION PRESSURIZED (MISCELLANEOUS) IMPLANT
WIRE NITINOL .018 (WIRE) ×1 IMPLANT

## 2022-04-15 NOTE — Interval H&P Note (Signed)
History and Physical Interval Note:  04/15/2022 1:17 PM  Joe [name]  has presented today for surgery, with the diagnosis of LT Cath   Abnormal CTA.  The various methods of treatment have been discussed with the patient and family. After consideration of risks, benefits and other options for treatment, the patient has consented to  Procedure(s): LEFT HEART CATH AND CORONARY ANGIOGRAPHY (Left)  PERCUTANEOUS CORONARY INTERVENTION  as a surgical intervention.  The patient's history has been reviewed, patient examined, no change in status, stable for surgery.  I have reviewed the patient's chart and labs.  Questions were answered to the patient's satisfaction.   Cath Lab Visit (complete for each Cath Lab visit)  Clinical Evaluation Leading to the Procedure:   ACS: No.  Non-ACS:    Anginal Classification: CCS II  Anti-ischemic medical therapy: Minimal Therapy (1 class of medications)  Non-Invasive Test Results: Equivocal test results - FFRct negative, but regional WMA on Echo  Prior CABG: No previous CABG    Glenetta Hew

## 2022-04-15 NOTE — Discharge Instructions (Signed)
Ulnar Site Care   The following information offers guidance on how to care for yourself after your procedure. Your health care provider may also give you more specific instructions. If you have problems or questions, contact your health care provider. What can I expect after the procedure? After the procedure, it is common to have bruising and tenderness in the incision area. Follow these instructions at home: Incision site care  Follow instructions from your health care provider about how to take care of your incision site. Make sure you: Wash your hands with soap and water for at least 20 seconds before and after you change your bandage (dressing). If soap and water are not available, use hand sanitizer. Change or remove your dressing as told by your health care provider. Leave stitches (sutures), skin glue, or adhesive strips in place. These skin closures may need to stay in place for 2 weeks or longer. If adhesive strip edges start to loosen and curl up, you may trim the loose edges. Do not remove adhesive strips completely unless your health care provider tells you to do that. Do not take baths, swim, or use a hot tub until your health care provider approves. You may shower 24-48 hours after the procedure or as told by your health care provider. Remove the dressing and gently wash the incision area with plain soap and water. Pat the area dry with a clean towel. Do not rub the site. That could cause bleeding. Do not apply powder or lotion to the site. Check your incision site every day for signs of infection. Check for: Redness, swelling, or pain. Fluid or blood. Warmth. Pus or a bad smell. Activity For 24 hours after the procedure, or as directed by your health care provider: Do not flex or bend the affected arm. Do not push or pull heavy objects with the affected arm. Do not operate machinery or power tools. Do not drive. You should not drive yourself home from the hospital or  clinic if you go home during that time period. You may drive 24 hours after the procedure unless your health care provider tells you not to. Do not lift anything that is heavier than 10 lb (4.5 kg), or the limit that you are told, until your health care provider says that it is safe. Return to your normal activities as told by your health care provider. Ask your health care provider what activities are safe for you and when you can return to work. If you were given a sedative during the procedure, it can affect you for several hours. Do not drive or operate machinery until your health care provider says that it is safe. General instructions Take over-the-counter and prescription medicines only as told by your health care provider. If you will be going home right after the procedure, plan to have a responsible adult care for you for the time you are told. This is important. Keep all follow-up visits. This is important. Contact a health care provider if: You have a fever or chills. You have any of these signs of infection at your incision site: Redness, swelling, or pain. Fluid or blood. Warmth. Pus or a bad smell. Get help right away if: The incision area swells very fast. The incision area is bleeding, and the bleeding does not stop when you hold steady pressure on the area. Your arm or hand becomes pale, cool, tingly, or numb. These symptoms may represent a serious problem that is an emergency. Do not wait to  see if the symptoms will go away. Get medical help right away. Call your local emergency services (911 in the [place] Do not drive yourself to the hospital. Summary After the procedure, it is common to have bruising and tenderness at the incision site. Follow instructions from your health care provider about how to take care of your radial site incision. Check the incision every day for signs of infection. Do not lift anything that is heavier than 10 lb (4.5 kg), or the limit that you are  told, until your health care provider says that it is safe. Get help right away if the incision area swells very fast, you have bleeding at the incision site that will not stop, or your arm or hand becomes pale, cool, or numb. This information is not intended to replace advice given to you by your health care provider. Make sure you discuss any questions you have with your health care provider. Document Revised: 01/04/2021 Document Reviewed: 01/04/2021 Elsevier Patient Education  [place].

## 2022-04-16 ENCOUNTER — Encounter: Payer: Self-pay | Admitting: Cardiology

## 2022-05-13 IMAGING — CT CT HEART MORP W/ CTA COR W/ SCORE W/ CA W/CM &/OR W/O CM
2 of 11 series · 7 of 20 positions shown, 8 images · non-contrast
Comparison: Prior CT of the chest on 01/07/2009

Addendum:
CLINICAL DATA: Chest pain

EXAM:
Cardiac/Coronary  CTA
TECHNIQUE: The patient was scanned on a Siemens Somatom go.Top scanner.

[Series 5: multiphase % cta coronary 0.60 · axial · 0.36mm/px · z∈[-1102,-1022]mm · 4 of 3652 slices shown, 5 images]
[im 731/3652  vessel]
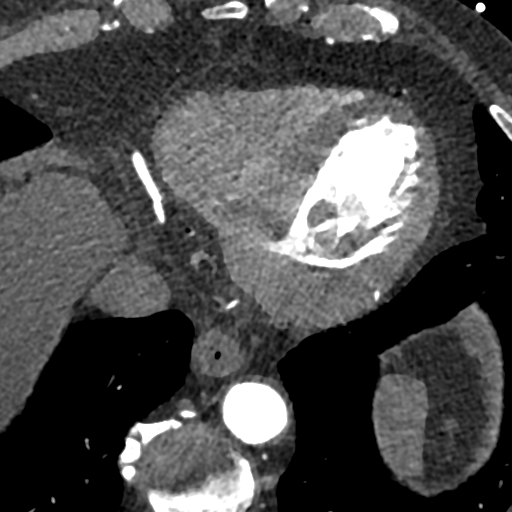
[im 731/3652  lung]
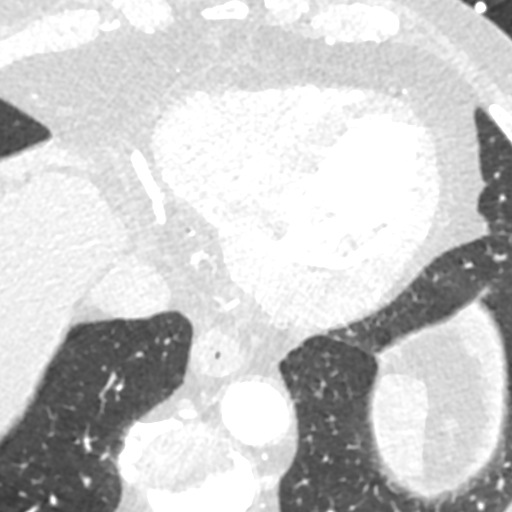
[im 1461/3652  vessel]
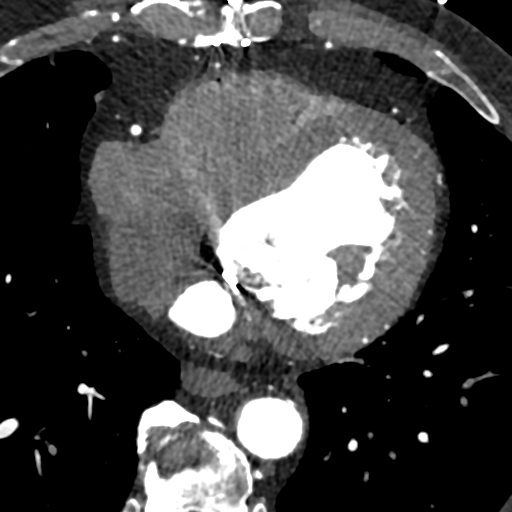
[im 2191/3652  vessel]
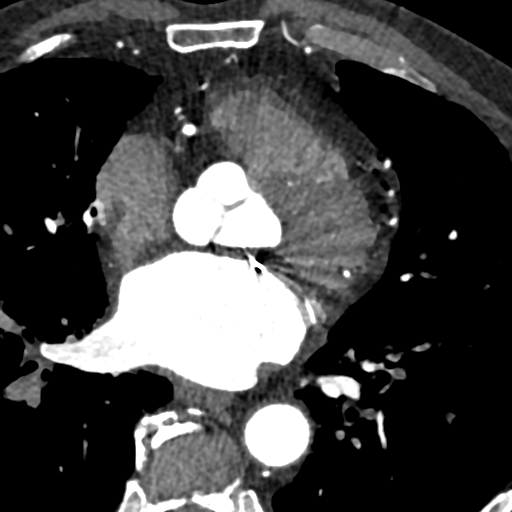
[im 2921/3652  vessel]
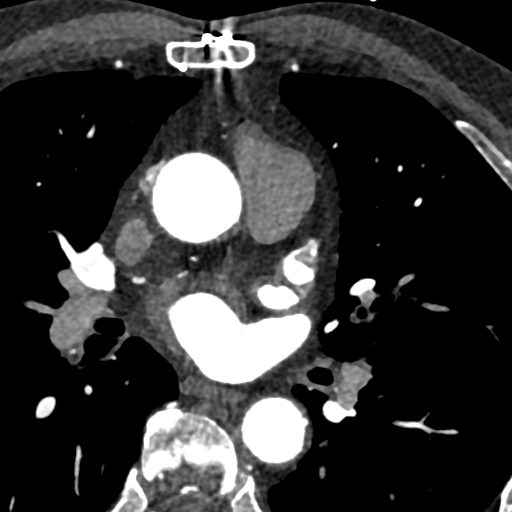

[Series 34: ms multiphase cta coronary 0.60 · axial · 0.36mm/px · z∈[-1096,-1029]mm · 3 of 2988 slices shown]
[im 747/2988  vessel]
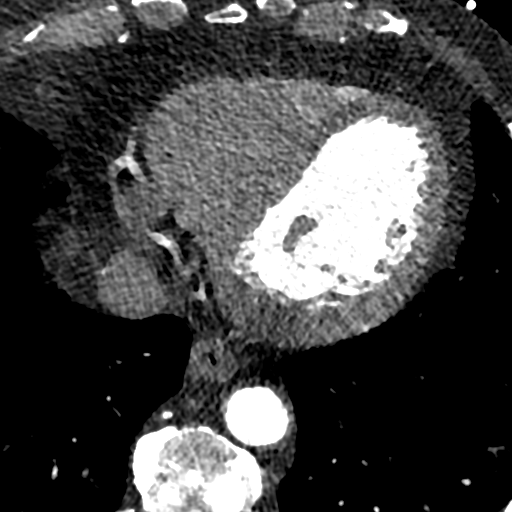
[im 1494/2988  vessel]
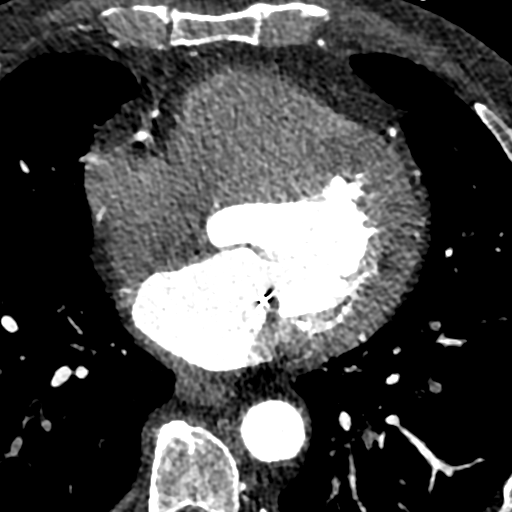
[im 2241/2988  vessel]
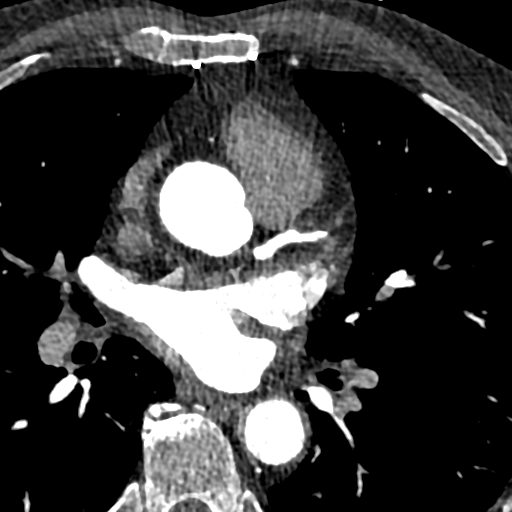

[7 of 20 positions shown; findings below may reference images not displayed]

:
A retrospective scan was triggered in the descending thoracic aorta.
Axial non-contrast 3 mm slices were carried out through the heart.
The data set was analyzed on a dedicated work station and scored
using the Agatson method. Gantry rotation speed was 330 msecs and
collimation was .6 mm. 100mg of metoprolol and 0.8 mg of sl NTG was
given. The 3D data set was reconstructed in 5% intervals of the
60-95 % of the R-R cycle. Diastolic phases were analyzed on a
dedicated work station using MPR, MIP and VRT modes. The patient
received 75 cc of contrast.
FINDINGS: Aorta: Normal size. Aortic root and descending aorta calcifications.
No dissection.

Aortic Valve:  Trileaflet.  No calcifications.

Coronary Arteries:  Normal coronary origin.  Right dominance.

RCA is a dominant artery that gives rise to PDA and PLA. There is
calcified plaque in the proximal RCA causing minimal stenosis (25%).

Left main gives rise to LAD and LCX arteries.  LM has no disease.

LAD has calcified plaque in the proximal segment causing minimal
stenosis (<25%). There is non calcified plaque in the proximal to
mid segment causing severe stenosis (>70%).

LCX is a non-dominant artery that gives rise to one OM1 branch.
There is no plaque.

Other findings:

Normal pulmonary vein drainage into the left atrium.

Normal left atrial appendage without a thrombus.

Normal size of the pulmonary artery.
IMPRESSION: 1. Coronary calcium score of 125. This was 66th percentile for age
and sex matched control.

2. Normal coronary origin with right dominance.

3. Non calcified plaque in the proximal-mid LAD causing severe
stenosis (>70%).

4. Calcified plaque in the proximal RCA causing minimal stenosis.

5. CAD-RADS 4 Severe stenosis. Cardiac catheterization is
recommended. Consider symptom-guided anti-ischemic pharmacotherapy
as well as risk factor modification per guideline directed care.

6. Additional analysis with CT FFR will be submitted and reported
separately.

EXAM:
OVER-READ INTERPRETATION  CT CHEST

The following report is an over-read performed by radiologist Dr.
over-read does not include interpretation of cardiac or coronary
anatomy or pathology. The coronary CTA interpretation by the
cardiologist is attached.
FINDINGS: No significant noncardiac vascular findings. Visualized mediastinum
and hilar regions demonstrate no lymphadenopathy or masses.
Visualized lungs show no evidence of pulmonary edema, consolidation,
pneumothorax, nodule or pleural fluid. The visualized upper abdomen
and bony structures are unremarkable.
IMPRESSION: No significant incidental findings.

*** End of Addendum ***
:
A retrospective scan was triggered in the descending thoracic aorta.
Axial non-contrast 3 mm slices were carried out through the heart.
The data set was analyzed on a dedicated work station and scored
using the Agatson method. Gantry rotation speed was 330 msecs and
collimation was .6 mm. 100mg of metoprolol and 0.8 mg of sl NTG was
given. The 3D data set was reconstructed in 5% intervals of the
60-95 % of the R-R cycle. Diastolic phases were analyzed on a
dedicated work station using MPR, MIP and VRT modes. The patient
received 75 cc of contrast.
FINDINGS: Aorta: Normal size. Aortic root and descending aorta calcifications.
No dissection.

Aortic Valve:  Trileaflet.  No calcifications.

Coronary Arteries:  Normal coronary origin.  Right dominance.

RCA is a dominant artery that gives rise to PDA and PLA. There is
calcified plaque in the proximal RCA causing minimal stenosis (25%).

Left main gives rise to LAD and LCX arteries.  LM has no disease.

LAD has calcified plaque in the proximal segment causing minimal
stenosis (<25%). There is non calcified plaque in the proximal to
mid segment causing severe stenosis (>70%).

LCX is a non-dominant artery that gives rise to one OM1 branch.
There is no plaque.

Other findings:

Normal pulmonary vein drainage into the left atrium.

Normal left atrial appendage without a thrombus.

Normal size of the pulmonary artery.
IMPRESSION: 1. Coronary calcium score of 125. This was 66th percentile for age
and sex matched control.

2. Normal coronary origin with right dominance.

3. Non calcified plaque in the proximal-mid LAD causing severe
stenosis (>70%).

4. Calcified plaque in the proximal RCA causing minimal stenosis.

5. CAD-RADS 4 Severe stenosis. Cardiac catheterization is
recommended. Consider symptom-guided anti-ischemic pharmacotherapy
as well as risk factor modification per guideline directed care.

6. Additional analysis with CT FFR will be submitted and reported
separately.

## 2022-05-18 ENCOUNTER — Ambulatory Visit: Payer: Federal, State, Local not specified - PPO | Admitting: Cardiovascular Disease

## 2022-06-03 NOTE — Progress Notes (Signed)
Cardiology Office Note  Date:  06/04/2022   ID:  Joe [name], DOB [birth year], MRN [id number]  PCP:  Pcp, No   Chief Complaint  Patient presents with   Follow-up    HPI:  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 Coronary calcium score of 125. This was 66th percentile for age and sex matched control.   2. Normal coronary origin with right dominance.   3. Non calcified plaque in the proximal-mid LAD causing severe stenosis (>70%).   4. Calcified plaque in the proximal RCA causing minimal stenosis.   5. CAD-RADS 4 Severe stenosis. Cardiac catheterization is recommended. Consider symptom-guided anti-ischemic pharmacotherapy as well as risk factor modification per guideline directed care.  FFR analysis did not note severe stenosis  Cardiac catheterization performed Apr 15, 2022  showing 50% proximal LAD lesion, medical management recommended   Echocardiogram Apr 08, 2022  1. Left ventricular ejection fraction, by estimation, is 35 to 40%. The  left ventricle has moderately decreased function. The left ventricle  demonstrates regional wall motion abnormalities (mild global hypokinesis  with moderate hypokinesis  of the  anterior/anteroseptal and apical region). Left ventricular diastolic  parameters are consistent with Grade I diastolic dysfunction (impaired  relaxation). The average left ventricular global longitudinal strain is  -10.9 %. The global longitudinal strain is   abnormal.   2. Right ventricular systolic function is normal. The right ventricular  size is normal.   3. The mitral valve has been repaired/replaced. Moderate mitral valve  regurgitation. No evidence of mitral stenosis.   4. The aortic valve is normal in structure. Aortic valve regurgitation is  mild. No aortic stenosis is present.   5. The inferior vena cava is normal in size with greater than 50%  respiratory variability, suggesting right atrial pressure of 3 mmHg.   Since his last clinic visit reports no further episodes of chest pain Active, works for the post office No regular exercise program, does work in his garden Smoker 1 pack/day  Strong family history, father with cardiac disease    PMH:   has a past medical history of Hypertension.  PSH:    Past Surgical History:  Procedure Laterality Date   ESOPHAGOGASTRODUODENOSCOPY (EGD) WITH PROPOFOL N/A 06/25/2018   Procedure: ESOPHAGOGASTRODUODENOSCOPY (EGD) WITH PROPOFOL;  Surgeon: Toney Reil, MD;  Location: [facility] ENDOSCOPY;  Service: Gastroenterology;  Laterality: N/A;   LEFT HEART CATH AND CORONARY ANGIOGRAPHY Left 04/15/2022   Procedure: LEFT HEART CATH AND CORONARY ANGIOGRAPHY;  Surgeon: Marykay Lex, MD;  Location: [facility] INVASIVE CV LAB;  Service: Cardiovascular;  Laterality: Left;   MITRAL VALVE REPAIR        Allergies:   Patient has no known allergies.   Social History:  The patient  reports that he has been smoking cigarettes. He has a 30.00 pack-year smoking history. He has never used smokeless tobacco. He reports current alcohol use. He reports that he does not use drugs.   Family History:   family history includes Heart failure in his  father.    Review of Systems: Review of Systems  Constitutional: Negative.   HENT: Negative.    Respiratory: Negative.    Cardiovascular:  Positive for chest pain.  Gastrointestinal: Negative.   Musculoskeletal: Negative.   Neurological: Negative.   Psychiatric/Behavioral: Negative.    All other systems reviewed and are negative.    PHYSICAL EXAM: VS:  BP 114/74 (BP Location: Left Arm, Patient Position: Sitting)   Pulse 83   Ht 5' 11.5" (1.816 m)   Wt 187 lb 12.8 oz (85.2 kg)   SpO2 93%   BMI 25.83 kg/m  , BMI Body mass index is 25.83 kg/m. Constitutional:  oriented to person, place, and time. No distress.  HENT:  Head: Grossly normal Eyes:  no discharge. No scleral icterus.  Neck: No JVD, no carotid bruits  Cardiovascular: Regular rate and rhythm, no murmurs appreciated Pulmonary/Chest: Clear to auscultation bilaterally, no wheezes or rails Abdominal: Soft.  no distension.  no tenderness.  Musculoskeletal: Normal range of motion Neurological:  normal muscle tone. Coordination normal. No atrophy Skin: Skin warm and dry Psychiatric: normal affect, pleasant  Recent Labs: 04/08/2022: BUN 12; Creatinine, Ser 1.15; Hemoglobin 16.1; Platelets 241; Potassium 3.8; Sodium 135    Lipid Panel Lab Results  Component Value Date   CHOL 184 03/14/2013   HDL 38 (L) 03/14/2013   LDLCALC 103 (H) 03/14/2013   TRIG 214 (H) 03/14/2013    Wt Readings from Last 3 Encounters:  06/04/22 187 lb 12.8 oz (85.2 kg)  04/15/22 188 lb (85.3 kg)  04/08/22 188 lb (85.3 kg)     ASSESSMENT AND PLAN:  Problem List Items Addressed This Visit       Cardiology Problems   Coronary artery disease of native artery of native heart with stable angina pectoris (HCC) - Primary   Other Visit Diagnoses     Smoker       Mixed hyperlipidemia       Ischemic cardiomyopathy       H/O mitral valve repair         Coronary disease with stable angina Cardiac cath, nonobstructive LAD  disease Currently with no symptoms of angina. No further workup at this time. Continue current medication regimen. Stressed importance of smoking cessation  Mitral valve repair 2006 Functioning well on echocardiogram There is moderate eccentric to septal wall regurgitation Possibly exacerbated by low ejection fraction and wall motion abnormality, ischemia as detailed above  Dilated ischemic cardiomyopathy Mild global hypokinesis, anterior, anteroseptal and apical region with moderate hypokinesis concerning for ischemia Nonobstructive disease On aspirin, statin, beta-blocker, ARB Etoh cessation  Smoker Long discussion with him concerning need for smoking cessation We have provided prescription for Chantix   Total encounter time more than 30 minutes  Greater than 50% was spent in counseling and coordination of care with the patient    Signed, Dossie Arbour, M.D., Ph.D. [facility] [place], [place] [phone]

## 2022-06-04 ENCOUNTER — Other Ambulatory Visit
Admission: RE | Admit: 2022-06-04 | Discharge: 2022-06-04 | Disposition: A | Discharge status: Home / Self Care | Payer: Federal, State, Local not specified - PPO | Attending: Cardiovascular Disease | Admitting: Cardiovascular Disease

## 2022-06-04 ENCOUNTER — Ambulatory Visit (INDEPENDENT_AMBULATORY_CARE_PROVIDER_SITE_OTHER): Payer: Federal, State, Local not specified - PPO | Admitting: Cardiovascular Disease

## 2022-06-04 ENCOUNTER — Encounter: Payer: Self-pay | Admitting: Cardiovascular Disease

## 2022-06-04 VITALS — BP 114/74 | HR 83 | Ht 71.5 in | Wt 187.8 lb

## 2022-06-04 DIAGNOSIS — E782 Mixed hyperlipidemia: Secondary | ICD-10-CM | POA: Insufficient documentation

## 2022-06-04 DIAGNOSIS — I255 Ischemic cardiomyopathy: Secondary | ICD-10-CM | POA: Diagnosis not present

## 2022-06-04 DIAGNOSIS — I25118 Atherosclerotic heart disease of native coronary artery with other forms of angina pectoris: Secondary | ICD-10-CM | POA: Diagnosis not present

## 2022-06-04 DIAGNOSIS — Z9889 Other specified postprocedural states: Secondary | ICD-10-CM

## 2022-06-04 DIAGNOSIS — F17200 Nicotine dependence, unspecified, uncomplicated: Secondary | ICD-10-CM

## 2022-06-04 LAB — LDL CHOLESTEROL, DIRECT: Direct LDL: 90 mg/dL (ref 0–99)

## 2022-06-04 LAB — LIPID PANEL
Cholesterol: 225 mg/dL — ABNORMAL HIGH (ref 0–200)
HDL: 34 mg/dL — ABNORMAL LOW (ref >40)
LDL Cholesterol: UNDETERMINED mg/dL (ref 0–99)
Total CHOL/HDL Ratio: 6.6 RATIO
Triglycerides: 475 mg/dL — ABNORMAL HIGH (ref <150)
VLDL: UNDETERMINED mg/dL (ref 0–40)

## 2022-06-04 MED ORDER — VARENICLINE TARTRATE 1 MG PO TABS
1.0000 mg | ORAL_TABLET | Freq: Two times a day (BID) | ORAL | 2 refills | Status: DC
Start: 2022-06-04 — End: 2023-12-13

## 2022-06-04 NOTE — Patient Instructions (Addendum)
Medication Instructions:  Chantix 1 mg twice a day Start 1/2 pill twice a day for 2 weeks then increase up to a full pull  If you need a refill on your cardiac medications before your next appointment, please call your pharmacy.   Lab work: Today: lipid profile  Medical Mall Entrance at [facility] 1st desk on the right to check in (REGISTRATION)  Lab hours: Monday- Friday (7:30 am- 5:30 pm)   Testing/Procedures: No new testing needed  Follow-Up: At [facility], you and your health needs are our priority.  As part of our continuing mission to provide you with exceptional heart care, we have created designated Provider Care Teams.  These Care Teams include your primary Cardiologist (physician) and Advanced Practice Providers (APPs -  Physician Assistants and Nurse Practitioners) who all work together to provide you with the care you need, when you need it.  You will need a follow up appointment in 12 months  Providers on your designated Care Team:   Nicolasa Ducking, NP Eula Listen, PA-C Cadence Fransico Michael, [place]  COVID-19 Vaccine Information can be found at: [email or web address] For questions related to vaccine distribution or appointments, please email [email or web address] or call [phone].

## 2022-06-08 ENCOUNTER — Telehealth: Payer: Self-pay | Admitting: Emergency Medicine

## 2022-06-08 MED ORDER — ROSUVASTATIN CALCIUM 40 MG PO TABS: 40.0000 mg | ORAL_TABLET | Freq: Every day | ORAL | 3 refills | Status: DC

## 2022-06-08 MED ORDER — EZETIMIBE 10 MG PO TABS: 10.0000 mg | ORAL_TABLET | Freq: Every day | ORAL | 3 refills | Status: DC

## 2022-06-08 NOTE — Telephone Encounter (Signed)
-----   Message from Antonieta Iba, MD sent at 06/06/2022 12:42 PM EDT ----- Cholesterol running higher than before Goal LDL less than 70 preferably less than 60  currently running 90 Crestor 10 is not effective enough Recommend increasing up to Crestor 40, add Zetia 10 mg daily as well

## 2022-06-08 NOTE — Telephone Encounter (Signed)
Called patient to go over results. No answer. Left detailed message per DPR.   Updated meds sent into pharmacy.

## 2022-07-01 ENCOUNTER — Telehealth: Payer: Self-pay | Admitting: Cardiovascular Disease

## 2022-07-01 NOTE — Telephone Encounter (Signed)
Patient called to get testing results.

## 2023-12-05 DIAGNOSIS — D485 Neoplasm of uncertain behavior of skin: Secondary | ICD-10-CM | POA: Diagnosis not present

## 2023-12-05 DIAGNOSIS — D2262 Melanocytic nevi of left upper limb, including shoulder: Secondary | ICD-10-CM | POA: Diagnosis not present

## 2023-12-05 DIAGNOSIS — L820 Inflamed seborrheic keratosis: Secondary | ICD-10-CM | POA: Diagnosis not present

## 2023-12-05 DIAGNOSIS — D225 Melanocytic nevi of trunk: Secondary | ICD-10-CM | POA: Diagnosis not present

## 2023-12-05 DIAGNOSIS — L821 Other seborrheic keratosis: Secondary | ICD-10-CM | POA: Diagnosis not present

## 2023-12-05 DIAGNOSIS — D2261 Melanocytic nevi of right upper limb, including shoulder: Secondary | ICD-10-CM | POA: Diagnosis not present

## 2023-12-05 DIAGNOSIS — L728 Other follicular cysts of the skin and subcutaneous tissue: Secondary | ICD-10-CM | POA: Diagnosis not present

## 2023-12-05 DIAGNOSIS — L280 Lichen simplex chronicus: Secondary | ICD-10-CM | POA: Diagnosis not present

## 2023-12-13 ENCOUNTER — Ambulatory Visit: Payer: Federal, State, Local not specified - PPO | Attending: Medical | Admitting: Medical

## 2023-12-13 ENCOUNTER — Encounter: Payer: Self-pay | Admitting: Medical

## 2023-12-13 VITALS — BP 126/82 | HR 84 | Ht 71.5 in | Wt 177.0 lb

## 2023-12-13 DIAGNOSIS — I340 Nonrheumatic mitral (valve) insufficiency: Secondary | ICD-10-CM

## 2023-12-13 DIAGNOSIS — Z79899 Other long term (current) drug therapy: Secondary | ICD-10-CM | POA: Diagnosis not present

## 2023-12-13 DIAGNOSIS — I200 Unstable angina: Secondary | ICD-10-CM

## 2023-12-13 DIAGNOSIS — I25118 Atherosclerotic heart disease of native coronary artery with other forms of angina pectoris: Secondary | ICD-10-CM | POA: Diagnosis not present

## 2023-12-13 DIAGNOSIS — I10 Essential (primary) hypertension: Secondary | ICD-10-CM | POA: Diagnosis not present

## 2023-12-13 DIAGNOSIS — Z9889 Other specified postprocedural states: Secondary | ICD-10-CM | POA: Diagnosis not present

## 2023-12-13 DIAGNOSIS — I059 Rheumatic mitral valve disease, unspecified: Secondary | ICD-10-CM

## 2023-12-13 MED ORDER — CARVEDILOL 3.125 MG PO TABS: 3.1250 mg | ORAL_TABLET | Freq: Two times a day (BID) | ORAL | 3 refills | Status: DC

## 2023-12-13 MED ORDER — VARENICLINE TARTRATE 1 MG PO TABS: 1.0000 mg | ORAL_TABLET | Freq: Two times a day (BID) | ORAL | 2 refills | Status: DC

## 2023-12-13 MED ORDER — ROSUVASTATIN CALCIUM 40 MG PO TABS: 40.0000 mg | ORAL_TABLET | Freq: Every day | ORAL | 3 refills | Status: DC

## 2023-12-13 NOTE — Patient Instructions (Signed)
 Medication Instructions:  Your physician recommends that you continue on your current medications as directed. Please refer to the Current Medication list given to you today.   *If you need a refill on your cardiac medications before your next appointment, please call your pharmacy*   Lab Work: Your provider would like for you to have following labs drawn today (CBC, CMP, Lipid, TSH).     Testing/Procedures: Your physician has requested that you have an echocardiogram. Echocardiography is a painless test that uses sound waves to create images of your heart. It provides your doctor with information about the size and shape of your heart and how well your heart's chambers and valves are working.   You may receive an ultrasound enhancing agent through an IV if needed to better visualize your heart during the echo. This procedure takes approximately one hour.  There are no restrictions for this procedure.  This will take place at [address] [facility] [address]) [address], [place] [address]  Please note: We ask at that you not bring children with you during ultrasound (echo/ vascular) testing. Due to room size and safety concerns, children are not allowed in the ultrasound rooms during exams. Our front office staff cannot provide observation of children in our lobby area while testing is being conducted. An adult accompanying a patient to their appointment will only be allowed in the ultrasound room at the discretion of the ultrasound technician under special circumstances. We apologize for any inconvenience.    Follow-Up: At [facility], you and your health needs are our priority.  As part of our continuing mission to provide you with exceptional heart care, we have created designated Provider Care Teams.  These Care Teams include your primary Cardiologist (physician) and Advanced Practice Providers (APPs -  Physician Assistants and Nurse Practitioners) who all work together to  provide you with the care you need, when you need it.  We recommend signing up for the patient portal called MyChart.  Sign up information is provided on this After Visit Summary.  MyChart is used to connect with patients for Virtual Visits (Telemedicine).  Patients are able to view lab/test results, encounter notes, upcoming appointments, etc.  Non-urgent messages can be sent to your provider as well.   To learn more about what you can do with MyChart, go to [email or web address].    Your next appointment:   6 month(s)  Provider:   Mikey Fishman, PA-C

## 2023-12-13 NOTE — Progress Notes (Signed)
 Cardiology Office Note:    Date:  12/13/2023   ID:  Joe [name], DOB 02-26-1961, MRN [id number]  PCP:  Pcp, No  [facility] Cardiologist:  None  [facility] Electrophysiologist:  None   Referring MD: No ref. provider found   Chief Complaint: 1 year follow-up  History of Present Illness:    Joe [name] is a 63 y.o. male with a hx of hypertension, nonobstructive CAD, smoker, mitral valve repair in 2006 who presents for follow-up.  In May 2023 coronary calcium  score 125, 66 percentile, noncalcified plaque in the proximal to mid LAD causing severe stenosis greater than 70%, calcified plaque in the proximal RCA, minimal stenosis.  FFR did not show any significant stenosis.  Showed LVEF 35 to 40%, grade 1 diastolic dysfunction, moderate MR.  Cardiac cath showed 50% proximal LAD lesion, normal LVEDP, no aortic valve stenosis.  Medical management was recommended.  Patient was last seen in July 2023 and is overall doing well from a cardiac perspective.  Today, the patient is overall doing well. 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 He quit taking his meds 2-3 months after he got them (in July 2023). He is still smoking. He is willing to try chantix . He denies chest pain, SOB, lower leg edema. He is walking a lot at his job. He denies lightheadedness, dizziness, palpations, heart racing.  Past Medical History:  Diagnosis Date   Hypertension     Past Surgical History:  Procedure Laterality Date   ESOPHAGOGASTRODUODENOSCOPY (EGD) WITH PROPOFOL  N/A 06/25/2018   Procedure: ESOPHAGOGASTRODUODENOSCOPY (EGD) WITH PROPOFOL ;  Surgeon: Unk Corinn Skiff, MD;  Location: [facility] ENDOSCOPY;  Service: Gastroenterology;  Laterality: N/A;   LEFT HEART CATH AND CORONARY ANGIOGRAPHY Left 04/15/2022   Procedure: LEFT HEART CATH AND CORONARY ANGIOGRAPHY;  Surgeon: Anner Alm ORN, MD;  Location: [facility] INVASIVE CV LAB;  Service: Cardiovascular;  Laterality: Left;   TRANSCATHETER MITRAL EDGE TO EDGE REPAIR       Current Medications: No outpatient medications have been marked as taking for the 12/13/23 encounter (Office Visit) with Franchester, Rosemarie Galvis H, PA-C.   Current Facility-Administered Medications for the 12/13/23 encounter (Office Visit) with Franchester, Saphyra Hutt H, PA-C  Medication   sodium chloride  flush (NS) 0.9 % injection 3 mL     Allergies:   Patient has no known allergies.   Social History   Socioeconomic History   Marital status: Single    Spouse name: Not on file   Number of children: Not on file   Years of education: Not on file   Highest education level: Not on file  Occupational History   Not on file  Tobacco Use   Smoking status: Every Day    Current packs/day: 0.75    Average packs/day: 0.8 packs/day for 40.0 years (30.0 ttl pk-yrs)    Types: Cigarettes   Smokeless tobacco: Never  Vaping Use   Vaping status: Never Used  Substance and Sexual Activity   Alcohol use: Yes   Drug use: Never   Sexual activity: Not on file  Other Topics Concern   Not on file  Social History Narrative   Not on file   Social Drivers of Health   Financial Resource Strain: Not on file  Food Insecurity: Not on file  Transportation Needs: Not on file  Physical Activity: Not on file  Stress: Not on file  Social Connections: Not on file     Family History: The patient's family history includes Heart failure in his father.  ROS:   Please see the history of present illness.     All other systems reviewed and are negative.  EKGs/Labs/Other Studies Reviewed:    The following studies were reviewed today:  LHC 03/2022   Prox LAD lesion is 50% stenosed.   LV end diastolic pressure is normal.   There is no aortic valve stenosis.   At most 45 to 50% ostial LAD lesion, but otherwise normal coronary arteries. ->  Very responsive to IC NTG Normal LVEDP     Recommendations Based on FFR ct findings showing this lesion was not significant, I chose to proceed with medical management.  Added  low-dose Imdur . Monitor symptoms, if symptoms progress, there could be progression of disease.  Would be a very difficult place to lay on the stent based on location.   Alm Clay, MD  Echo 03/2022 1. Left ventricular ejection fraction, by estimation, is 35 to 40%. The  left ventricle has moderately decreased function. The left ventricle  demonstrates regional wall motion abnormalities (mild global hypokinesis  with moderate hypokinesis of the  anterior/anteroseptal and apical region). Left ventricular diastolic  parameters are consistent with Grade I diastolic dysfunction (impaired  relaxation). The average left ventricular global longitudinal strain is  -10.9 %. The global longitudinal strain is   abnormal.   2. Right ventricular systolic function is normal. The right ventricular  size is normal.   3. The mitral valve has been repaired/replaced. Moderate mitral valve  regurgitation. No evidence of mitral stenosis.   4. The aortic valve is normal in structure. Aortic valve regurgitation is  mild. No aortic stenosis is present.   5. The inferior vena cava is normal in size with greater than 50%  respiratory variability, suggesting right atrial pressure of 3 mmHg.   Comparison(s): EF 35-40%; Dilated LA and RA; MVR.   EKG:  EKG is ordered today.  The ekg ordered today demonstrates NSR 84bpm, TWI V3, V4, aVF, II  Recent Labs: No results found for requested labs within last 365 days.  Recent Lipid Panel    Component Value Date/Time   CHOL 225 (H) 06/04/2022 0901   CHOL 184 03/14/2013 0407   TRIG 475 (H) 06/04/2022 0901   TRIG 214 (H) 03/14/2013 0407   HDL 34 (L) 06/04/2022 0901   HDL 38 (L) 03/14/2013 0407   CHOLHDL 6.6 06/04/2022 0901   VLDL UNABLE TO CALCULATE IF TRIGLYCERIDE OVER 400 mg/dL 92/92/7976 9098   VLDL 43 (H) 03/14/2013 0407   LDLCALC UNABLE TO CALCULATE IF TRIGLYCERIDE OVER 400 mg/dL 92/92/7976 9098   LDLCALC 103 (H) 03/14/2013 0407   LDLDIRECT 90.0 06/04/2022  0901     Physical Exam:    VS:  BP 126/82   Pulse 84   Ht 5' 11.5 (1.816 m)   Wt 177 lb (80.3 kg)   SpO2 97%   BMI 24.34 kg/m     Wt Readings from Last 3 Encounters:  12/13/23 177 lb (80.3 kg)  06/04/22 187 lb 12.8 oz (85.2 kg)  04/15/22 188 lb (85.3 kg)     GEN:  Well nourished, well developed in no acute distress HEENT: Normal NECK: No JVD; No carotid bruits LYMPHATICS: No lymphadenopathy CARDIAC: RRR, no murmurs, rubs, gallops RESPIRATORY:  Clear to auscultation without rales, wheezing or rhonchi  ABDOMEN: Soft, non-tender, non-distended MUSCULOSKELETAL:  No edema; No deformity  SKIN: Warm and dry NEUROLOGIC:  Alert and oriented x 3 PSYCHIATRIC:  Normal affect   ASSESSMENT:    1. Coronary artery disease  of native artery of native heart with stable angina pectoris (HCC)   2. H/O mitral valve repair   3. Mitral valve insufficiency, unspecified etiology   4. Primary hypertension   5. Endocarditis of mitral valve   6. Unstable angina (HCC)   7. Medication management    PLAN:    In order of problems listed above:  CAD Cardiac cath in 2023 showed nonobstructive LAD disease. EKG today showed NSR with minor ischemic changes (TWI ant/inf leads with nonspecific ST changes). The patient denies anginal symptoms.  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  Patient was previously prescribed Imdur , Coreg , losartan , Zetia , Crestor , and aspirin .  However, he has only been taking aspirin .  I will refill Crestor  and Coreg .  Labs today with c-Met, CBC, lipid panel, TSH. I will order an echo.  Mitral valve repair in 2006 Repeat echo today.  HFrEF Notes suggest dilated ?ICM given wall motion abnormalities, however cath with nonobstructive disease.echo in 2023 showed LVEF 35-40%, + WMA, G1DD, normal RVSF, moderate MR with normally functioning valve. He quit taking Losartan  and Coreg . I will refill Coreg  for today and update an echocardiogram.  Tobacco use He is still  smoking. I will refill Chantix .  Disposition: Follow up in 6 month(s) with MD/APP    Signed, Mattson Dayal VEAR Fishman, PA-C  12/13/2023 9:43 AM    [facility]

## 2023-12-14 ENCOUNTER — Other Ambulatory Visit: Payer: Self-pay

## 2023-12-14 DIAGNOSIS — R82998 Other abnormal findings in urine: Secondary | ICD-10-CM | POA: Diagnosis not present

## 2023-12-14 DIAGNOSIS — Z20822 Contact with and (suspected) exposure to covid-19: Secondary | ICD-10-CM | POA: Diagnosis not present

## 2023-12-14 DIAGNOSIS — R059 Cough, unspecified: Secondary | ICD-10-CM | POA: Diagnosis not present

## 2023-12-14 DIAGNOSIS — R350 Frequency of micturition: Secondary | ICD-10-CM | POA: Diagnosis not present

## 2023-12-14 DIAGNOSIS — J069 Acute upper respiratory infection, unspecified: Secondary | ICD-10-CM | POA: Diagnosis not present

## 2023-12-14 DIAGNOSIS — Z79899 Other long term (current) drug therapy: Secondary | ICD-10-CM

## 2023-12-14 LAB — COMPREHENSIVE METABOLIC PANEL
ALT: 19 [IU]/L (ref 0–44)
AST: 25 [IU]/L (ref 0–40)
Albumin: 4.2 g/dL (ref 3.9–4.9)
Alkaline Phosphatase: 57 [IU]/L (ref 44–121)
BUN/Creatinine Ratio: 12 (ref 10–24)
BUN: 14 mg/dL (ref 8–27)
Bilirubin Total: 1 mg/dL (ref 0.0–1.2)
CO2: 25 mmol/L (ref 20–29)
Calcium: 9.5 mg/dL (ref 8.6–10.2)
Chloride: 102 mmol/L (ref 96–106)
Creatinine, Ser: 1.17 mg/dL (ref 0.76–1.27)
Globulin, Total: 2.4 g/dL (ref 1.5–4.5)
Glucose: 95 mg/dL (ref 70–99)
Potassium: 4.5 mmol/L (ref 3.5–5.2)
Sodium: 140 mmol/L (ref 134–144)
Total Protein: 6.6 g/dL (ref 6.0–8.5)
eGFR: 70 mL/min/{1.73_m2} (ref >59)

## 2023-12-14 LAB — LIPID PANEL
Chol/HDL Ratio: 4.4 {ratio} (ref 0.0–5.0)
Cholesterol, Total: 244 mg/dL — ABNORMAL HIGH (ref 100–199)
HDL: 55 mg/dL (ref >39)
LDL Chol Calc (NIH): 165 mg/dL — ABNORMAL HIGH (ref 0–99)
Triglycerides: 135 mg/dL (ref 0–149)
VLDL Cholesterol Cal: 24 mg/dL (ref 5–40)

## 2023-12-14 LAB — TSH: TSH: 1.95 u[IU]/mL (ref 0.450–4.500)

## 2023-12-14 LAB — CBC
Hematocrit: 49 % (ref 37.5–51.0)
Hemoglobin: 15.9 g/dL (ref 13.0–17.7)
MCH: 31.1 pg (ref 26.6–33.0)
MCHC: 32.4 g/dL (ref 31.5–35.7)
MCV: 96 fL (ref 79–97)
Platelets: 232 10*3/uL (ref 150–450)
RBC: 5.12 x10E6/uL (ref 4.14–5.80)
RDW: 11.7 % (ref 11.6–15.4)
WBC: 5.9 10*3/uL (ref 3.4–10.8)

## 2023-12-23 ENCOUNTER — Other Ambulatory Visit: Payer: Self-pay

## 2023-12-23 DIAGNOSIS — Z79899 Other long term (current) drug therapy: Secondary | ICD-10-CM

## 2023-12-24 LAB — LIPID PANEL
Chol/HDL Ratio: 3.1 {ratio} (ref 0.0–5.0)
Cholesterol, Total: 149 mg/dL (ref 100–199)
HDL: 48 mg/dL (ref >39)
LDL Chol Calc (NIH): 70 mg/dL (ref 0–99)
Triglycerides: 184 mg/dL — ABNORMAL HIGH (ref 0–149)
VLDL Cholesterol Cal: 31 mg/dL (ref 5–40)

## 2023-12-27 ENCOUNTER — Ambulatory Visit: Payer: Federal, State, Local not specified - PPO | Attending: Medical

## 2023-12-27 DIAGNOSIS — I340 Nonrheumatic mitral (valve) insufficiency: Secondary | ICD-10-CM

## 2023-12-27 DIAGNOSIS — Z9889 Other specified postprocedural states: Secondary | ICD-10-CM

## 2023-12-27 LAB — ECHOCARDIOGRAM COMPLETE
AV Mean grad: 3 mm[Hg]
AV Peak grad: 4.6 mm[Hg]
Ao pk vel: 1.07 m/s
Area-P 1/2: 2.73 cm2
MV M vel: 4.78 m/s
MV Peak grad: 91.4 mm[Hg]
Radius: 0.4 cm
S' Lateral: 4.1 cm

## 2024-01-04 ENCOUNTER — Other Ambulatory Visit: Payer: Self-pay | Admitting: Medical

## 2024-01-04 ENCOUNTER — Other Ambulatory Visit: Payer: Self-pay

## 2024-01-04 MED ORDER — LOSARTAN POTASSIUM 25 MG PO TABS: 25.0000 mg | ORAL_TABLET | Freq: Every day | ORAL | 3 refills | Status: DC

## 2024-02-07 ENCOUNTER — Telehealth: Payer: Self-pay | Admitting: Medical

## 2024-02-07 NOTE — Telephone Encounter (Signed)
*  STAT* If patient is at the pharmacy, call can be transferred to refill team.   1. Which medications need to be refilled? (please list name of each medication and dose if known) carvedilol (COREG) 3.125 MG tablet  losartan (COZAAR) 25 MG tablet  rosuvastatin (CRESTOR) 40 MG tablet  varenicline (CHANTIX) 1 MG tablet   2. Would you like to learn more about the convenience, safety, & potential cost savings by using the [facility]? no    3. Are you open to using the [facility] (Type [facility]. ).no   4. Which pharmacy/location (including street and city if local pharmacy) is medication to be sent to?[id number] - [place], [place] - [address]    5. Do they need a 30 day or 90 day supply? 30 day  Pt out of medication

## 2024-02-08 MED ORDER — ROSUVASTATIN CALCIUM 40 MG PO TABS: 40.0000 mg | ORAL_TABLET | Freq: Every day | ORAL | 5 refills | Status: DC

## 2024-02-08 MED ORDER — LOSARTAN POTASSIUM 25 MG PO TABS: 25.0000 mg | ORAL_TABLET | Freq: Every day | ORAL | 11 refills | Status: AC

## 2024-02-08 MED ORDER — CARVEDILOL 3.125 MG PO TABS: 3.1250 mg | ORAL_TABLET | Freq: Two times a day (BID) | ORAL | 5 refills | Status: DC

## 2024-02-08 NOTE — Telephone Encounter (Signed)
 Please advise if ok to refill Chantix or defer to PCP. Thank you so much.

## 2024-02-10 MED ORDER — VARENICLINE TARTRATE 1 MG PO TABS: 1.0000 mg | ORAL_TABLET | Freq: Two times a day (BID) | ORAL | 0 refills | Status: DC

## 2024-02-10 NOTE — Telephone Encounter (Signed)
 Refill sent as requested    Furth, Cadence H, PA-C  You2 hours ago (7:35 AM)    We can refill

## 2024-05-03 DIAGNOSIS — Z9889 Other specified postprocedural states: Secondary | ICD-10-CM | POA: Diagnosis not present

## 2024-05-03 DIAGNOSIS — S70262A Insect bite (nonvenomous), left hip, initial encounter: Secondary | ICD-10-CM | POA: Diagnosis not present

## 2024-05-03 DIAGNOSIS — Z8679 Personal history of other diseases of the circulatory system: Secondary | ICD-10-CM | POA: Diagnosis not present

## 2024-05-03 DIAGNOSIS — W57XXXA Bitten or stung by nonvenomous insect and other nonvenomous arthropods, initial encounter: Secondary | ICD-10-CM | POA: Diagnosis not present

## 2024-06-08 ENCOUNTER — Other Ambulatory Visit: Payer: Self-pay | Admitting: Medical

## 2024-06-08 DIAGNOSIS — L570 Actinic keratosis: Secondary | ICD-10-CM | POA: Diagnosis not present

## 2024-06-08 DIAGNOSIS — D225 Melanocytic nevi of trunk: Secondary | ICD-10-CM | POA: Diagnosis not present

## 2024-06-08 DIAGNOSIS — D2272 Melanocytic nevi of left lower limb, including hip: Secondary | ICD-10-CM | POA: Diagnosis not present

## 2024-06-08 DIAGNOSIS — D2261 Melanocytic nevi of right upper limb, including shoulder: Secondary | ICD-10-CM | POA: Diagnosis not present

## 2024-06-08 DIAGNOSIS — D2262 Melanocytic nevi of left upper limb, including shoulder: Secondary | ICD-10-CM | POA: Diagnosis not present

## 2024-08-07 ENCOUNTER — Other Ambulatory Visit: Payer: Self-pay | Admitting: Medical

## 2024-08-08 ENCOUNTER — Encounter: Payer: Self-pay | Admitting: Medical

## 2024-08-08 ENCOUNTER — Ambulatory Visit: Attending: Medical | Admitting: Medical

## 2024-08-08 VITALS — BP 126/72 | HR 74 | Ht 71.5 in | Wt 174.6 lb

## 2024-08-08 DIAGNOSIS — Z9889 Other specified postprocedural states: Secondary | ICD-10-CM

## 2024-08-08 DIAGNOSIS — I340 Nonrheumatic mitral (valve) insufficiency: Secondary | ICD-10-CM

## 2024-08-08 DIAGNOSIS — I5022 Chronic systolic (congestive) heart failure: Secondary | ICD-10-CM | POA: Diagnosis not present

## 2024-08-08 DIAGNOSIS — E782 Mixed hyperlipidemia: Secondary | ICD-10-CM

## 2024-08-08 DIAGNOSIS — Z720 Tobacco use: Secondary | ICD-10-CM

## 2024-08-08 DIAGNOSIS — I25118 Atherosclerotic heart disease of native coronary artery with other forms of angina pectoris: Secondary | ICD-10-CM | POA: Diagnosis not present

## 2024-08-08 NOTE — Addendum Note (Signed)
 Addended by: Ronnisha Felber E on: 08/08/2024 09:55 AM   Modules accepted: Orders

## 2024-08-08 NOTE — Patient Instructions (Addendum)
 Medication Instructions:  Your physician recommends that you continue on your current medications as directed. Please refer to the Current Medication list given to you today.  *If you need a refill on your cardiac medications before your next appointment, please call your pharmacy*  Lab Work: No labs ordered today  If you have labs (blood work) drawn today and your tests are completely normal, you will receive your results only by: MyChart Message (if you have MyChart) OR A paper copy in the mail If you have any lab test that is abnormal or we need to change your treatment, we will call you to review the results.  Testing/Procedures: Your physician has requested that you have a Echocardiogram IN [place] . Echocardiography is a painless test that uses sound waves to create images of your heart. It provides your doctor with information about the size and shape of your heart and how well your heart's chambers and valves are working.   You may receive an ultrasound enhancing agent through an IV if needed to better visualize your heart during the echo. This procedure takes approximately one hour.  There are no restrictions for this procedure.  This will take place at [address] [facility] [address] [facility]) [address], [place] [address]  Please note: We ask at that you not bring children with you during ultrasound (echo/ vascular) testing. Due to room size and safety concerns, children are not allowed in the ultrasound rooms during exams. Our front office staff cannot provide observation of children in our lobby area while testing is being conducted. An adult accompanying a patient to their appointment will only be allowed in the ultrasound room at the discretion of the ultrasound technician under special circumstances. We apologize for any inconvenience.   Follow-Up: At [facility], you and your health needs are our priority.  As part of our continuing mission to provide you with  exceptional heart care, our providers are all part of one team.  This team includes your primary Cardiologist (physician) and Advanced Practice Providers or APPs (Physician Assistants and Nurse Practitioners) who all work together to provide you with the care you need, when you need it.  Your next appointment:   6 month(s)  Provider:   You will see one of the following Advanced Practice Providers on your designated Care Team:   Lonni Meager, NP Lesley Maffucci, PA-C Bernardino Bring, PA-C Cadence Sells, PA-C Tylene Lunch, NP Barnie Hila, NP      We recommend signing up for the patient portal called MyChart.  Sign up information is provided on this After Visit Summary.  MyChart is used to connect with patients for Virtual Visits (Telemedicine).  Patients are able to view lab/test results, encounter notes, upcoming appointments, etc.  Non-urgent messages can be sent to your provider as well.   To learn more about what you can do with MyChart, go to [email or web address].

## 2024-08-08 NOTE — Progress Notes (Signed)
 Cardiology Office Note   Date:  08/08/2024  ID:  Joe [name], DOB [birth year], MRN [id number] PCP: Patient, No Pcp Per  [facility] Providers Cardiologist:  None    History of Present Illness Joe [name] is a 63 y.o. male  with a hx of hypertension, nonobstructive CAD, HFrEF/NICM, smoker, mitral valve repair in 2006 who presents for follow-up of HFrEF and mitral valve.   In May 2023 coronary calcium  score 125, 66 percentile, noncalcified plaque in the proximal to mid LAD causing severe stenosis greater than 70%, calcified plaque in the proximal RCA, minimal stenosis.  FFR did not show any significant stenosis.  Showed LVEF 35 to 40%, grade 1 diastolic dysfunction, moderate MR.  Cardiac cath showed 50% proximal LAD lesion, normal LVEDP, no aortic valve stenosis.  Medical management was recommended.  The patient was last seen 11/2023 and was overall doing well from a cardiac perspective.  Patient reports he quit taking his meds in July 2023.  He was still smoking.  Patient was only taking aspirin .  Crestor  and Coreg  were refilled.  Echocardiogram was updated.  Echocardiogram showed EF 35 to 40%, global hypokinesis, grade 1 diastolic dysfunction, moderate to severe MR.  Today, the patient reports he is occasionally taking medications. He is taking them every three days.  We discussed the importance of medication compliance. He denies SOB or chest pain. No lower leg edema. No dizziness or lightheadedness. He has been working in the garden and out in the yard every day.   Studies Reviewed EKG Interpretation Date/Time:  Wednesday August 08 2024 09:20:34 EDT Ventricular Rate:  74 PR Interval:  144 QRS Duration:  114 QT Interval:  430 QTC Calculation: 477 R Axis:   -6  Text Interpretation: Normal sinus rhythm Possible Left atrial enlargement Incomplete left bundle branch block Minimal voltage criteria for LVH, may be normal variant ( Cornell product ) Nonspecific T wave abnormality  Prolonged QT When compared with ECG of 13-Dec-2023 08:35, Nonspecific T wave abnormality no longer evident in Inferior leads Nonspecific T wave abnormality, improved in Lateral leads Confirmed by Franchester, Abanoub Hanken ([id number]) on 08/08/2024 9:28:09 AM    Echo 11/2023 1. Left ventricular ejection fraction, by estimation, is 35 to 40%. The  left ventricle has moderately decreased function. The left ventricle  demonstrates global hypokinesis. Left ventricular diastolic parameters are  consistent with Grade I diastolic  dysfunction (impaired relaxation). The average left ventricular global  longitudinal strain is -12.9 %. The global longitudinal strain is  abnormal.   2. Right ventricular systolic function is mildly reduced. The right  ventricular size is normal.   3. Left atrial size was mildly dilated.   4. The mitral valve has been repaired/replaced. Moderate to severe mitral  valve regurgitation. Mild mitral stenosis. The mean mitral valve gradient  is 6.0 mmHg.   5. The aortic valve is normal in structure. Aortic valve regurgitation is  not visualized. No aortic stenosis is present.   6. The inferior vena cava is normal in size with greater than 50%  respiratory variability, suggesting right atrial pressure of 3 mmHg.   LHC 03/2022   Prox LAD lesion is 50% stenosed.   LV end diastolic pressure is normal.   There is no aortic valve stenosis.   At most 45 to 50% ostial LAD lesion, but otherwise normal coronary arteries. ->  Very responsive to IC NTG Normal LVEDP     Recommendations Based on FFR ct findings showing this lesion was not significant,  I chose to proceed with medical management.  Added low-dose Imdur . Monitor symptoms, if symptoms progress, there could be progression of disease.  Would be a very difficult place to lay on the stent based on location.   Alm Clay, MD   Echo 03/2022 1. Left ventricular ejection fraction, by estimation, is 35 to 40%. The  left ventricle has  moderately decreased function. The left ventricle  demonstrates regional wall motion abnormalities (mild global hypokinesis  with moderate hypokinesis of the  anterior/anteroseptal and apical region). Left ventricular diastolic  parameters are consistent with Grade I diastolic dysfunction (impaired  relaxation). The average left ventricular global longitudinal strain is  -10.9 %. The global longitudinal strain is   abnormal.   2. Right ventricular systolic function is normal. The right ventricular  size is normal.   3. The mitral valve has been repaired/replaced. Moderate mitral valve  regurgitation. No evidence of mitral stenosis.   4. The aortic valve is normal in structure. Aortic valve regurgitation is  mild. No aortic stenosis is present.   5. The inferior vena cava is normal in size with greater than 50%  respiratory variability, suggesting right atrial pressure of 3 mmHg.   Comparison(s): EF 35-40%; Dilated LA and RA; MVR.        Physical Exam VS:  BP 126/72   Pulse 74   Ht 5' 11.5 (1.816 m)   Wt 174 lb 9.6 oz (79.2 kg)   SpO2 96%   BMI 24.01 kg/m        Wt Readings from Last 3 Encounters:  08/08/24 174 lb 9.6 oz (79.2 kg)  12/13/23 177 lb (80.3 kg)  06/04/22 187 lb 12.8 oz (85.2 kg)    GEN: Well nourished, well developed in no acute distress NECK: No JVD; No carotid bruits CARDIAC: RRR, + murmur, no rubs, gallops RESPIRATORY:  Clear to auscultation without rales, wheezing or rhonchi  ABDOMEN: Soft, non-tender, non-distended EXTREMITIES:  No edema; No deformity   ASSESSMENT AND PLAN  CAD Cardiac cath in 2023 showed nonobstructive LAD disease.  The patient denies anginal symptoms.  He remains active at home.  Patient has a history of noncompliance with medication.  He reports he takes his medications about every 3 days.  The importance of medication compliance was greatly discussed again.  Continue aspirin  81 mg daily, Coreg  3.125 mg twice daily, losartan  25 mg  daily, and Crestor  40 mg daily.  He reports he only takes Coreg  once a day.  Mitral valve repair in 2006 Last echo in January 2025 showed EF of 35 to 40%, moderate to severe MR, mean gradient 6 mmHg.  We will repeat an echocardiogram in December and discuss with MD plan (TEE vs cath). Patient is wanting to get procedures done sooner if possible as he plans to retire next year.  HFrEF Notes suggest dilated ICM given wall motion abnormalities, however cath showed nonobstructive disease. Valve disease may also be contributing. Echo in 2023 showed LVEF 35-40%, G1DD, normal RVSF, mod MR with normally functioning valve disease. Repeat echo 11/2023 showed LVEF 35-40%, mod to severe MR. Recommended taking Coreg  and Losartan  daily.   Tobacco use He is still smoking. Cessation recommended.   HLD LDL 70. Continue Crestor  40mg  daily.      Dispo: Follow-up   Signed, Raunak Antuna VEAR Fishman, PA-C

## 2024-08-30 ENCOUNTER — Other Ambulatory Visit: Payer: Self-pay | Admitting: Medical

## 2024-10-29 ENCOUNTER — Ambulatory Visit: Attending: Cardiovascular Disease

## 2024-10-29 DIAGNOSIS — Z9889 Other specified postprocedural states: Secondary | ICD-10-CM | POA: Diagnosis not present

## 2024-10-29 DIAGNOSIS — I25118 Atherosclerotic heart disease of native coronary artery with other forms of angina pectoris: Secondary | ICD-10-CM

## 2024-10-29 LAB — ECHOCARDIOGRAM COMPLETE
AV Mean grad: 2 mmHg
AV Peak grad: 4.2 mmHg
Ao pk vel: 1.02 m/s
Area-P 1/2: 2.76 cm2
S' Lateral: 3.46 cm

## 2024-10-31 ENCOUNTER — Ambulatory Visit: Payer: Self-pay | Admitting: Medical
